# Patient Record
Sex: Female | Born: 1960 | Race: Asian | Hispanic: No | Marital: Married | Smoking: Never smoker
Health system: Southern US, Community
[De-identification: ages and names within clinical notes are randomized; demographics above are authoritative.]

## PROBLEM LIST (undated history)

## (undated) DIAGNOSIS — I10 Essential (primary) hypertension: Secondary | ICD-10-CM

## (undated) HISTORY — DX: Essential (primary) hypertension: I10

---

## 1981-04-25 HISTORY — PX: TUBAL LIGATION: SHX77

## 2013-12-23 ENCOUNTER — Ambulatory Visit (INDEPENDENT_AMBULATORY_CARE_PROVIDER_SITE_OTHER): Payer: 59 | Admitting: Internal Medicine

## 2013-12-23 ENCOUNTER — Encounter (INDEPENDENT_AMBULATORY_CARE_PROVIDER_SITE_OTHER): Payer: Self-pay

## 2013-12-23 ENCOUNTER — Encounter: Payer: Self-pay | Admitting: Internal Medicine

## 2013-12-23 VITALS — BP 110/70 | HR 84 | Temp 98.5°F | Ht 60.25 in | Wt 128.0 lb

## 2013-12-23 DIAGNOSIS — I10 Essential (primary) hypertension: Secondary | ICD-10-CM | POA: Insufficient documentation

## 2013-12-23 MED ORDER — AZILSARTAN MEDOXOMIL 80 MG PO TABS: 0.5000 | ORAL_TABLET | Freq: Every day | ORAL | Status: DC

## 2013-12-23 NOTE — Assessment & Plan Note (Signed)
Well controlled on current medication Refilled x 1 year  RTC in 1 year or sooner if needed

## 2013-12-23 NOTE — Patient Instructions (Addendum)

## 2013-12-23 NOTE — Progress Notes (Signed)
HPI  Pt presents to the clinic today to establish care. She recently moved from [place]. She has no concerns today.  Flu: never Tetanus: 10/2013 Pap Smear: 2014 Mammogram: 2013 Colon Screening: 2014 Eye Doctor: yearly Dentist: biannually  Past Medical History  Diagnosis Date  . Hypertension     Current Outpatient Prescriptions  Medication Sig Dispense Refill  . Azilsartan Medoxomil 80 MG TABS Take 0.5 tablets by mouth daily.       No current facility-administered medications for this visit.    No Known Allergies  Family History  Problem Relation Age of Onset  . Hypertension Mother     History   Social History  . Marital Status: Married    Spouse Name: N/A    Number of Children: N/A  . Years of Education: N/A   Occupational History  . Not on file.   Social History Main Topics  . Smoking status: Never Smoker   . Smokeless tobacco: Never Used  . Alcohol Use: Yes     Comment: occasional  . Drug Use: Not on file  . Sexual Activity: Not on file   Other Topics Concern  . Not on file   Social History Narrative  . No narrative on file    ROS:  Constitutional: Denies fever, malaise, fatigue, headache or abrupt weight changes.  HEENT: Denies eye pain, eye redness, ear pain, ringing in the ears, wax buildup, runny nose, nasal congestion, bloody nose, or sore throat. Respiratory: Denies difficulty breathing, shortness of breath, cough or sputum production.   Cardiovascular: Denies chest pain, chest tightness, palpitations or swelling in the hands or feet.  Gastrointestinal: Denies abdominal pain, bloating, constipation, diarrhea or blood in the stool.  GU: Denies frequency, urgency, pain with urination, blood in urine, odor or discharge. Musculoskeletal: Denies decrease in range of motion, difficulty with gait, muscle pain or joint pain and swelling.  Skin: Denies redness, rashes, lesions or ulcercations.  Neurological: Denies dizziness, difficulty with memory,  difficulty with speech or problems with balance and coordination.   No other specific complaints in a complete review of systems (except as listed in HPI above).  PE:  BP 110/70  Pulse 84  Temp(Src) 98.5 F (36.9 C) (Oral)  Ht 5' 0.25" (1.53 m)  Wt 128 lb (58.06 kg)  BMI 24.80 kg/m2  SpO2 97% Wt Readings from Last 3 Encounters:  12/23/13 128 lb (58.06 kg)    General: Appears her stated age, well developed, well nourished in NAD. Cardiovascular: Normal rate and rhythm. S1,S2 noted.  No murmur, rubs or gallops noted. No JVD or BLE edema. No carotid bruits noted. Pulmonary/Chest: Normal effort and positive vesicular breath sounds. No respiratory distress. No wheezes, rales or ronchi noted.  Neurological: Alert and oriented.      Assessment and Plan:

## 2013-12-23 NOTE — Progress Notes (Signed)
Pre visit review using our clinic review tool, if applicable. No additional management support is needed unless otherwise documented below in the visit note. 

## 2014-05-22 ENCOUNTER — Ambulatory Visit (INDEPENDENT_AMBULATORY_CARE_PROVIDER_SITE_OTHER): Payer: 59 | Admitting: Internal Medicine

## 2014-05-22 ENCOUNTER — Encounter: Payer: Self-pay | Admitting: Internal Medicine

## 2014-05-22 VITALS — BP 114/80 | HR 77 | Temp 98.2°F | Wt 118.0 lb

## 2014-05-22 DIAGNOSIS — I10 Essential (primary) hypertension: Secondary | ICD-10-CM

## 2014-05-22 NOTE — Progress Notes (Signed)
Pre visit review using our clinic review tool, if applicable. No additional management support is needed unless otherwise documented below in the visit note. 

## 2014-05-22 NOTE — Patient Instructions (Signed)

## 2014-05-22 NOTE — Progress Notes (Signed)
   Subjective:    Patient ID: Hannah Brooks, female    DOB: [birth year], 54 y.o.   MRN: [id number]  HPI  Pt presents to the clinic today to have a form filled out for work. 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 Her only PMH is high blood pressure which is well controlled on Azilsartan Medoxomil. Her BP today is 144/80. She takes her medication as prescribed. She denies side effects. She denies history of mental or emotional problems.  Review of Systems  Past Medical History  Diagnosis Date  . Hypertension     Current Outpatient Prescriptions  Medication Sig Dispense Refill  . Azilsartan Medoxomil 80 MG TABS Take 0.5 tablets (40 mg total) by mouth daily. 30 tablet 11   No current facility-administered medications for this visit.    No Known Allergies  Family History  Problem Relation Age of Onset  . Hypertension Mother   . Heart disease Father   . Cancer Neg Hx     History   Social History  . Marital Status: Married    Spouse Name: N/A    Number of Children: N/A  . Years of Education: N/A   Occupational History  . Not on file.   Social History Main Topics  . Smoking status: Never Smoker   . Smokeless tobacco: Never Used  . Alcohol Use: Yes     Comment: occasional  . Drug Use: No  . Sexual Activity: Not on file   Other Topics Concern  . Not on file   Social History Narrative     Constitutional: Denies fever, malaise, fatigue, headache or abrupt weight changes.  Respiratory: Denies difficulty breathing, shortness of breath, cough or sputum production.   Cardiovascular: Denies chest pain, chest tightness, palpitations or swelling in the hands or feet.  Musculoskeletal: Denies decrease in range of motion, difficulty with gait, muscle pain or joint pain and swelling.  Skin: Denies redness, rashes, lesions or ulcercations.  Neurological: Denies dizziness, difficulty with  memory, difficulty with speech or problems with balance and coordination.  Psych: Denies anxiety, depression, SI/HI.  No other specific complaints in a complete review of systems (except as listed in HPI above).     Objective:   Physical Exam   BP 114/80 mmHg  Pulse 77  Temp(Src) 98.2 F (36.8 C) (Oral)  Wt 118 lb (53.524 kg)  SpO2 98% Wt Readings from Last 3 Encounters:  05/22/14 118 lb (53.524 kg)  12/23/13 128 lb (58.06 kg)    General: Appears her stated age, well developed, well nourished in NAD. Skin: Warm, dry and intact. No rashes, lesions or ulcerations noted.  Cardiovascular: Normal rate and rhythm. S1,S2 noted.  No murmur, rubs or gallops noted. No JVD or BLE edema. No carotid bruits noted. Pulmonary/Chest: Normal effort and positive vesicular breath sounds. No respiratory distress. No wheezes, rales or ronchi noted.  Musculoskeletal: Normal range of motion. Strength 5/5 BUE/BLE. No difficulty with gait.  Neurological: Alert and oriented. Cranial nerves II-XII grossly intact. Coordination normal.  Psychiatric: Mood and affect normal. Behavior is normal. Judgment and thought content normal.        Assessment & Plan:   Form filled out that attests to her physical and mental health Form was copied and scanned into chart  RTC in 6 months or sooner if needed

## 2014-05-22 NOTE — Assessment & Plan Note (Signed)
Well controlled on current dose of Azilsartan Medoxomil She declines CBC and CMET at todays visit.

## 2014-05-23 ENCOUNTER — Telehealth: Payer: Self-pay | Admitting: Internal Medicine

## 2014-05-23 NOTE — Telephone Encounter (Signed)
emmi emailed °

## 2014-11-13 ENCOUNTER — Telehealth: Payer: Self-pay

## 2014-11-13 NOTE — Telephone Encounter (Signed)
Called patient to notify her of being due for a Mammogram. Patient stated that she is on a business trip in [place] right now and will be back on August 3rd. Patient says that she will call back when she returns home to set up a Mammogram.

## 2015-02-10 ENCOUNTER — Ambulatory Visit (INDEPENDENT_AMBULATORY_CARE_PROVIDER_SITE_OTHER): Payer: 59 | Admitting: Internal Medicine

## 2015-02-10 ENCOUNTER — Encounter: Payer: Self-pay | Admitting: Internal Medicine

## 2015-02-10 VITALS — BP 120/84 | HR 71 | Temp 98.1°F | Ht 60.33 in | Wt 133.0 lb

## 2015-02-10 DIAGNOSIS — D229 Melanocytic nevi, unspecified: Secondary | ICD-10-CM

## 2015-02-10 DIAGNOSIS — Z0000 Encounter for general adult medical examination without abnormal findings: Secondary | ICD-10-CM

## 2015-02-10 DIAGNOSIS — Z0001 Encounter for general adult medical examination with abnormal findings: Secondary | ICD-10-CM | POA: Diagnosis not present

## 2015-02-10 DIAGNOSIS — Z1239 Encounter for other screening for malignant neoplasm of breast: Secondary | ICD-10-CM

## 2015-02-10 DIAGNOSIS — E781 Pure hyperglyceridemia: Secondary | ICD-10-CM

## 2015-02-10 DIAGNOSIS — I10 Essential (primary) hypertension: Secondary | ICD-10-CM

## 2015-02-10 LAB — COMPREHENSIVE METABOLIC PANEL
ALK PHOS: 98 U/L (ref 39–117)
ALT: 25 U/L (ref 0–35)
AST: 19 U/L (ref 0–37)
Albumin: 4.2 g/dL (ref 3.5–5.2)
BUN: 24 mg/dL — ABNORMAL HIGH (ref 6–23)
CO2: 29 mEq/L (ref 19–32)
Calcium: 9.6 mg/dL (ref 8.4–10.5)
Chloride: 104 mEq/L (ref 96–112)
Creatinine, Ser: 0.82 mg/dL (ref 0.40–1.20)
GFR: 76.47 mL/min (ref >60.00)
GLUCOSE: 101 mg/dL — AB (ref 70–99)
POTASSIUM: 4.1 meq/L (ref 3.5–5.1)
Sodium: 138 mEq/L (ref 135–145)
Total Bilirubin: 0.3 mg/dL (ref 0.2–1.2)
Total Protein: 7.7 g/dL (ref 6.0–8.3)

## 2015-02-10 LAB — CBC
HEMATOCRIT: 40.5 % (ref 36.0–46.0)
HEMOGLOBIN: 13.3 g/dL (ref 12.0–15.0)
MCHC: 32.9 g/dL (ref 30.0–36.0)
MCV: 89.6 fl (ref 78.0–100.0)
Platelets: 298 10*3/uL (ref 150.0–400.0)
RBC: 4.52 Mil/uL (ref 3.87–5.11)
RDW: 12.4 % (ref 11.5–15.5)
WBC: 9.6 10*3/uL (ref 4.0–10.5)

## 2015-02-10 LAB — LIPID PANEL
CHOL/HDL RATIO: 5
Cholesterol: 190 mg/dL (ref 0–200)
HDL: 36.7 mg/dL — AB (ref >39.00)
Triglycerides: 679 mg/dL — ABNORMAL HIGH (ref 0.0–149.0)

## 2015-02-10 LAB — LDL CHOLESTEROL, DIRECT: Direct LDL: 82 mg/dL

## 2015-02-10 NOTE — Progress Notes (Signed)
Subjective:    Patient ID: Hannah Brooks, female    DOB: 04-Sep-1960, 54 y.o.   MRN: [id number]  HPI  Pt presents to the clinic today for her annual exam. She is also due for follow up HTN (see separate note).  Flu: never, will get today Tetanus: 10/2013 Pap Smear: 2014 Mammogram: 2013 Colon Screening: 2014 Vision Screening: yearly Dentist: biannually  Diet: She does not eat meat. She consumes mostly fruits and veggies. She does try to avoid fried foods. Exercise: None.  Review of Systems      Past Medical History  Diagnosis Date  . Hypertension     Current Outpatient Prescriptions  Medication Sig Dispense Refill  . Azilsartan Medoxomil 80 MG TABS Take 0.5 tablets (40 mg total) by mouth daily. 30 tablet 11   No current facility-administered medications for this visit.    No Known Allergies  Family History  Problem Relation Age of Onset  . Hypertension Mother   . Heart disease Father   . Cancer Neg Hx     Social History   Social History  . Marital Status: Married    Spouse Name: N/A  . Number of Children: N/A  . Years of Education: N/A   Occupational History  . Not on file.   Social History Main Topics  . Smoking status: Never Smoker   . Smokeless tobacco: Never Used  . Alcohol Use: Yes     Comment: occasional  . Drug Use: No  . Sexual Activity: Not on file   Other Topics Concern  . Not on file   Social History Narrative     Constitutional: Denies fever, malaise, fatigue, headache or abrupt weight changes.  HEENT: Denies eye pain, eye redness, ear pain, ringing in the ears, wax buildup, runny nose, nasal congestion, bloody nose, or sore throat. Respiratory: Denies difficulty breathing, shortness of breath, cough or sputum production.   Cardiovascular: Denies chest pain, chest tightness, palpitations or swelling in the hands or feet.  Gastrointestinal: Denies abdominal pain, bloating, constipation, diarrhea or blood in the stool.  GU: Denies  urgency, frequency, pain with urination, burning sensation, blood in urine, odor or discharge. Musculoskeletal: Denies decrease in range of motion, difficulty with gait, muscle pain or joint pain and swelling.  Skin: Pt reports mole under her right eye. Denies redness, rashes, or ulcercations.  Neurological: Denies dizziness, difficulty with memory, difficulty with speech or problems with balance and coordination.  Psych: Denies anxiety, depression, SI/HI.  No other specific complaints in a complete review of systems (except as listed in HPI above).  Objective:   Physical Exam   BP 120/84 mmHg  Pulse 71  Temp(Src) 98.1 F (36.7 C) (Oral)  Ht 5' 0.33" (1.532 m)  Wt 133 lb (60.328 kg)  BMI 25.70 kg/m2  SpO2 98% Wt Readings from Last 3 Encounters:  02/10/15 133 lb (60.328 kg)  05/22/14 118 lb (53.524 kg)  12/23/13 128 lb (58.06 kg)    General: Appears her stated age, well developed, well nourished in NAD. Skin: Warm, dry and intact.3 small round nevi noted under her right eye. HEENT: Head: normal shape and size; Eyes: sclera white, no icterus, conjunctiva pink, PERRLA and EOMs intact; Ears: Tm's gray and intact, normal light reflex; Throat/Mouth: Teeth present, mucosa pink and moist, no exudate, lesions or ulcerations noted.  Neck:  Neck supple, trachea midline. No masses, lumps or thyromegaly present.  Cardiovascular: Normal rate and rhythm. S1,S2 noted.  No murmur, rubs or gallops noted. No JVD  or BLE edema. No carotid bruits noted. Pulmonary/Chest: Normal effort and positive vesicular breath sounds. No respiratory distress. No wheezes, rales or ronchi noted.  Abdomen: Soft and nontender. Normal bowel sounds. No distention or masses noted. Liver, spleen and kidneys non palpable. Musculoskeletal: Strength 5/5 BUE/BLE. No signs of joint swelling. No difficulty with gait.  Neurological: Alert and oriented. Cranial nerves II-XII grossly intact. Coordination normal.  Psychiatric: Mood  and affect normal. Behavior is normal. Judgment and thought content normal.       Assessment & Plan:   Preventative Health Maintenance:  Encouraged her to consume a balanced diet and start and exercise regimen She declines flu shot today Tetanus UTD Mammogram ordered, she will call [facility] to schedule Pap due 2017 Colonoscopy due 2024 Encouraged her to see an eye doctor and dentist annually  HPI:  She is also due for follow up of HTN. She takes her Azilsartan daily as prescribed. She denies chest pain, chest tightness or shortness of breath. There is no ECG in the system for review. Her BP today is 120/84.  Review of Systems:   Past Medical History  Diagnosis Date  . Hypertension     Current Outpatient Prescriptions  Medication Sig Dispense Refill  . Azilsartan Medoxomil 80 MG TABS Take 0.5 tablets (40 mg total) by mouth daily. 30 tablet 11   No current facility-administered medications for this visit.    No Known Allergies  Family History  Problem Relation Age of Onset  . Hypertension Mother   . Heart disease Father   . Cancer Neg Hx     Social History   Social History  . Marital Status: Married    Spouse Name: N/A  . Number of Children: N/A  . Years of Education: N/A   Occupational History  . Not on file.   Social History Main Topics  . Smoking status: Never Smoker   . Smokeless tobacco: Never Used  . Alcohol Use: Yes     Comment: occasional  . Drug Use: No  . Sexual Activity: Not on file   Other Topics Concern  . Not on file   Social History Narrative     Constitutional: Denies fever, malaise, fatigue, headache or abrupt weight changes.  HEENT: Denies eye pain, eye redness, ear pain, ringing in the ears, wax buildup, runny nose, nasal congestion, bloody nose, or sore throat. Respiratory: Denies difficulty breathing, shortness of breath, cough or sputum production.   Cardiovascular: Denies chest pain, chest tightness, palpitations or  swelling in the hands or feet.  Gastrointestinal: Denies abdominal pain, bloating, constipation, diarrhea or blood in the stool.  GU: Denies urgency, frequency, pain with urination, burning sensation, blood in urine, odor or discharge. Musculoskeletal: Denies decrease in range of motion, difficulty with gait, muscle pain or joint pain and swelling.  Skin: Denies redness, rashes, lesions or ulcercations.  Neurological: Denies dizziness, difficulty with memory, difficulty with speech or problems with balance and coordination.  Psych: Denies anxiety, depression, SI/HI.  No other specific complaints in a complete review of systems (except as listed in HPI above).  Objective: BP 120/84 mmHg  Pulse 71  Temp(Src) 98.1 F (36.7 C) (Oral)  Ht 5' 0.33" (1.532 m)  Wt 133 lb (60.328 kg)  BMI 25.70 kg/m2  SpO2 98%  General: Appears her stated age, well developed, well nourished in NAD. Skin: Warm, dry and intact. 3 small round nevi noted under her right eye. HEENT: Head: normal shape and size; Eyes: sclera white, no icterus, conjunctiva  pink, PERRLA and EOMs intact; Ears: Tm's gray and intact, normal light reflex; Throat/Mouth: Teeth present, mucosa pink and moist, no exudate, lesions or ulcerations noted.  Neck:  Neck supple, trachea midline. No masses, lumps or thyromegaly present.  Cardiovascular: Normal rate and rhythm. S1,S2 noted.  No murmur, rubs or gallops noted. No JVD or BLE edema. No carotid bruits noted. Pulmonary/Chest: Normal effort and positive vesicular breath sounds. No respiratory distress. No wheezes, rales or ronchi noted.  Abdomen: Soft and nontender. Normal bowel sounds. No distention or masses noted. Liver, spleen and kidneys non palpable. Musculoskeletal: Strength 5/5 BUE/BLE. No signs of joint swelling. No difficulty with gait.  Neurological: Alert and oriented. Cranial nerves II-XII grossly intact. Coordination normal.   Assessment and Plan:  HTN:  Will check CBC and  CMET today ECG today normal Azilsartan refilled today per request  [place]:  Benign Referral to derm to have them removed  RTC in 1 year or sooner if needed

## 2015-02-10 NOTE — Patient Instructions (Signed)
Health Maintenance, Female Adopting a healthy lifestyle and getting preventive care can go a long way to promote health and wellness. Talk with your health care provider about what schedule of regular examinations is right for you. This is a good chance for you to check in with your provider about disease prevention and staying healthy. In between checkups, there are plenty of things you can do on your own. Experts have done a lot of research about which lifestyle changes and preventive measures are most likely to keep you healthy. Ask your health care provider for more information. WEIGHT AND DIET  Eat a healthy diet  Be sure to include plenty of vegetables, fruits, low-fat dairy products, and lean protein.  Do not eat a lot of foods high in solid fats, added sugars, or salt.  Get regular exercise. This is one of the most important things you can do for your health.  Most adults should exercise for at least 150 minutes each week. The exercise should increase your heart rate and make you sweat (moderate-intensity exercise).  Most adults should also do strengthening exercises at least twice a week. This is in addition to the moderate-intensity exercise.  Maintain a healthy weight  Body mass index (BMI) is a measurement that can be used to identify possible weight problems. It estimates body fat based on height and weight. Your health care provider can help determine your BMI and help you achieve or maintain a healthy weight.  For females 20 years of age and older:   A BMI below 18.5 is considered underweight.  A BMI of 18.5 to 24.9 is normal.  A BMI of 25 to 29.9 is considered overweight.  A BMI of 30 and above is considered obese.  Watch levels of cholesterol and blood lipids  You should start having your blood tested for lipids and cholesterol at 56 years of age, then have this test every 5 years.  You may need to have your cholesterol levels checked more often if:  Your lipid  or cholesterol levels are high.  You are older than 56 years of age.  You are at high risk for heart disease.  CANCER SCREENING   Lung Cancer  Lung cancer screening is recommended for adults 55-80 years old who are at high risk for lung cancer because of a history of smoking.  A yearly low-dose CT scan of the lungs is recommended for people who:  Currently smoke.  Have quit within the past 15 years.  Have at least a 30-pack-year history of smoking. A pack year is smoking an average of one pack of cigarettes a day for 1 year.  Yearly screening should continue until it has been 15 years since you quit.  Yearly screening should stop if you develop a health problem that would prevent you from having lung cancer treatment.  Breast Cancer  Practice breast self-awareness. This means understanding how your breasts normally appear and feel.  It also means doing regular breast self-exams. Let your health care provider know about any changes, no matter how small.  If you are in your 20s or 30s, you should have a clinical breast exam (CBE) by a health care provider every 1-3 years as part of a regular health exam.  If you are 40 or older, have a CBE every year. Also consider having a breast X-ray (mammogram) every year.  If you have a family history of breast cancer, talk to your health care provider about genetic screening.  If you   are at high risk for breast cancer, talk to your health care provider about having an MRI and a mammogram every year.  Breast cancer gene (BRCA) assessment is recommended for women who have family members with BRCA-related cancers. BRCA-related cancers include:  Breast.  Ovarian.  Tubal.  Peritoneal cancers.  Results of the assessment will determine the need for genetic counseling and BRCA1 and BRCA2 testing. Cervical Cancer Your health care provider may recommend that you be screened regularly for cancer of the pelvic organs (ovaries, uterus, and  vagina). This screening involves a pelvic examination, including checking for microscopic changes to the surface of your cervix (Pap test). You may be encouraged to have this screening done every 3 years, beginning at age 21.  For women ages 30-65, health care providers may recommend pelvic exams and Pap testing every 3 years, or they may recommend the Pap and pelvic exam, combined with testing for human papilloma virus (HPV), every 5 years. Some types of HPV increase your risk of cervical cancer. Testing for HPV may also be done on women of any age with unclear Pap test results.  Other health care providers may not recommend any screening for nonpregnant women who are considered low risk for pelvic cancer and who do not have symptoms. Ask your health care provider if a screening pelvic exam is right for you.  If you have had past treatment for cervical cancer or a condition that could lead to cancer, you need Pap tests and screening for cancer for at least 20 years after your treatment. If Pap tests have been discontinued, your risk factors (such as having a new sexual partner) need to be reassessed to determine if screening should resume. Some women have medical problems that increase the chance of getting cervical cancer. In these cases, your health care provider may recommend more frequent screening and Pap tests. Colorectal Cancer  This type of cancer can be detected and often prevented.  Routine colorectal cancer screening usually begins at 56 years of age and continues through 56 years of age.  Your health care provider may recommend screening at an earlier age if you have risk factors for colon cancer.  Your health care provider may also recommend using home test kits to check for hidden blood in the stool.  A small camera at the end of a tube can be used to examine your colon directly (sigmoidoscopy or colonoscopy). This is done to check for the earliest forms of colorectal  cancer.  Routine screening usually begins at age 50.  Direct examination of the colon should be repeated every 5-10 years through 56 years of age. However, you may need to be screened more often if early forms of precancerous polyps or small growths are found. Skin Cancer  Check your skin from head to toe regularly.  Tell your health care provider about any new moles or changes in moles, especially if there is a change in a mole's shape or color.  Also tell your health care provider if you have a mole that is larger than the size of a pencil eraser.  Always use sunscreen. Apply sunscreen liberally and repeatedly throughout the day.  Protect yourself by wearing long sleeves, pants, a wide-brimmed hat, and sunglasses whenever you are outside. HEART DISEASE, DIABETES, AND HIGH BLOOD PRESSURE   High blood pressure causes heart disease and increases the risk of stroke. High blood pressure is more likely to develop in:  People who have blood pressure in the high end   of the normal range (130-139/85-89 mm Hg).  People who are overweight or obese.  People who are African American.  If you are 38-23 years of age, have your blood pressure checked every 3-5 years. If you are 61 years of age or older, have your blood pressure checked every year. You should have your blood pressure measured twice--once when you are at a hospital or clinic, and once when you are not at a hospital or clinic. Record the average of the two measurements. To check your blood pressure when you are not at a hospital or clinic, you can use:  An automated blood pressure machine at a pharmacy.  A home blood pressure monitor.  If you are between 45 years and 39 years old, ask your health care provider if you should take aspirin to prevent strokes.  Have regular diabetes screenings. This involves taking a blood sample to check your fasting blood sugar level.  If you are at a normal weight and have a low risk for diabetes,  have this test once every three years after 56 years of age.  If you are overweight and have a high risk for diabetes, consider being tested at a younger age or more often. PREVENTING INFECTION  Hepatitis B  If you have a higher risk for hepatitis B, you should be screened for this virus. You are considered at high risk for hepatitis B if:  You were born in a country where hepatitis B is common. Ask your health care provider which countries are considered high risk.  Your parents were born in a high-risk country, and you have not been immunized against hepatitis B (hepatitis B vaccine).  You have HIV or AIDS.  You use needles to inject street drugs.  You live with someone who has hepatitis B.  You have had sex with someone who has hepatitis B.  You get hemodialysis treatment.  You take certain medicines for conditions, including cancer, organ transplantation, and autoimmune conditions. Hepatitis C  Blood testing is recommended for:  Everyone born from [birth year] through 1967.  Anyone with known risk factors for hepatitis C. Sexually transmitted infections (STIs)  You should be screened for sexually transmitted infections (STIs) including gonorrhea and chlamydia if:  You are sexually active and are younger than 56 years of age.  You are older than 56 years of age and your health care provider tells you that you are at risk for this type of infection.  Your sexual activity has changed since you were last screened and you are at an increased risk for chlamydia or gonorrhea. Ask your health care provider if you are at risk.  If you do not have HIV, but are at risk, it may be recommended that you take a prescription medicine daily to prevent HIV infection. This is called pre-exposure prophylaxis (PrEP). You are considered at risk if:  You are sexually active and do not regularly use condoms or know the HIV status of your partner(s).  You take drugs by injection.  You are sexually  active with a partner who has HIV. Talk with your health care provider about whether you are at high risk of being infected with HIV. If you choose to begin PrEP, you should first be tested for HIV. You should then be tested every 3 months for as long as you are taking PrEP.  PREGNANCY   If you are premenopausal and you may become pregnant, ask your health care provider about preconception counseling.  If you may  become pregnant, take 400 to 800 micrograms (mcg) of folic acid every day.  If you want to prevent pregnancy, talk to your health care provider about birth control (contraception). OSTEOPOROSIS AND MENOPAUSE   Osteoporosis is a disease in which the bones lose minerals and strength with aging. This can result in serious bone fractures. Your risk for osteoporosis can be identified using a bone density scan.  If you are 61 years of age or older, or if you are at risk for osteoporosis and fractures, ask your health care provider if you should be screened.  Ask your health care provider whether you should take a calcium or vitamin D supplement to lower your risk for osteoporosis.  Menopause may have certain physical symptoms and risks.  Hormone replacement therapy may reduce some of these symptoms and risks. Talk to your health care provider about whether hormone replacement therapy is right for you.  HOME CARE INSTRUCTIONS   Schedule regular health, dental, and eye exams.  Stay current with your immunizations.   Do not use any tobacco products including cigarettes, chewing tobacco, or electronic cigarettes.  If you are pregnant, do not drink alcohol.  If you are breastfeeding, limit how much and how often you drink alcohol.  Limit alcohol intake to no more than 1 drink per day for nonpregnant women. One drink equals 12 ounces of beer, 5 ounces of wine, or 1 ounces of hard liquor.  Do not use street drugs.  Do not share needles.  Ask your health care provider for help if  you need support or information about quitting drugs.  Tell your health care provider if you often feel depressed.  Tell your health care provider if you have ever been abused or do not feel safe at home.   This information is not intended to replace advice given to you by your health care provider. Make sure you discuss any questions you have with your health care provider.   Document Released: 10/25/2010 Document Revised: 05/02/2014 Document Reviewed: 03/13/2013 Elsevier Interactive Patient Education [employer].

## 2015-02-10 NOTE — Progress Notes (Signed)
   Subjective:    Patient ID: Hannah Brooks, female    DOB: [birth year], 54 y.o.   MRN: [id number]  HPI Hannah Brooks is a 54 year old female who presents today for her annual exam.  She has a past medical history of hypertension.  Today her blood pressure is 120/84, she has been taking her azilsartan medoxomil daily as prescribed. She does have some moles that she would like removed under her right eye, will make dermatology referral.    Flu: declines Tdap: July 2015 Pap: 2014, normal results  Mammogram: 2013 Vision: Yearly. Wears reading glasses.  Dentist: biannually   Diet: Mostly veggies, does not eat meat often. Includes fresh fruit in her diet.  Exercise: No regular exercise.    Review of Systems  Constitutional: Negative for fever, chills and fatigue.  HENT: Negative for congestion, postnasal drip and rhinorrhea.   Respiratory: Negative for cough, shortness of breath and wheezing.   Cardiovascular: Negative for chest pain, palpitations and leg swelling.  Gastrointestinal: Negative for diarrhea and constipation.  Genitourinary: Negative for dysuria, frequency and flank pain.  Musculoskeletal: Negative for myalgias, arthralgias and gait problem.  Skin: Negative for color change, pallor, rash and wound.  Neurological: Negative for dizziness, light-headedness and headaches.  Psychiatric/Behavioral: Negative for agitation. The patient is not nervous/anxious.    Family History  Problem Relation Age of Onset  . Hypertension Mother   . Heart disease Father   . Cancer Neg Hx    Current Outpatient Prescriptions on File Prior to Visit  Medication Sig Dispense Refill  . Azilsartan Medoxomil 80 MG TABS Take 0.5 tablets (40 mg total) by mouth daily. 30 tablet 11   No current facility-administered medications on file prior to visit.       Objective:   Physical Exam  Constitutional: She is oriented to person, place, and time. She appears well-developed and well-nourished.  HENT:    Head: Normocephalic and atraumatic.  Right Ear: External ear normal.  Left Ear: External ear normal.  Mouth/Throat: Oropharynx is clear and moist. No oropharyngeal exudate.  Eyes: Conjunctivae are normal. Pupils are equal, round, and reactive to light.  Neck: Normal range of motion. Neck supple. No thyromegaly present.  Cardiovascular: Normal rate, regular rhythm, normal heart sounds and intact distal pulses.   No murmur heard. Pulmonary/Chest: Effort normal and breath sounds normal.  Abdominal: Soft. Bowel sounds are normal. There is no tenderness.  Musculoskeletal: Normal range of motion.  Lymphadenopathy:    She has no cervical adenopathy.  Neurological: She is alert and oriented to person, place, and time.  Skin: Skin is warm and dry.    BP 120/84 mmHg  Pulse 71  Temp(Src) 98.1 F (36.7 C) (Oral)  Ht 5' 0.33" (1.532 m)  Wt 133 lb (60.328 kg)  BMI 25.70 kg/m2  SpO2 98%       Assessment & Plan:  1. Hypertension -BMP today. Refill Azilsartan 80mg  2. Obesity  -Lipid panel, TSH. 3. Preventative health Colonscopy not due until 2023 Mammogram: will set up

## 2015-02-10 NOTE — Progress Notes (Signed)
Pre visit review using our clinic review tool, if applicable. No additional management support is needed unless otherwise documented below in the visit note. 

## 2015-02-12 MED ORDER — EZETIMIBE 10 MG PO TABS: 10.0000 mg | ORAL_TABLET | Freq: Every day | ORAL | Status: DC

## 2015-02-12 NOTE — Addendum Note (Signed)
Addended by: Lurlean Nanny on: 02/12/2015 11:04 AM   Modules accepted: Orders

## 2015-05-15 ENCOUNTER — Other Ambulatory Visit (INDEPENDENT_AMBULATORY_CARE_PROVIDER_SITE_OTHER): Payer: 59

## 2015-05-15 DIAGNOSIS — E781 Pure hyperglyceridemia: Secondary | ICD-10-CM | POA: Diagnosis not present

## 2015-05-15 LAB — LIPID PANEL
CHOLESTEROL: 196 mg/dL (ref 125–200)
HDL: 41 mg/dL — ABNORMAL LOW (ref >=46)
LDL CALC: 112 mg/dL (ref <130)
Total CHOL/HDL Ratio: 4.8 Ratio (ref <=5.0)
Triglycerides: 215 mg/dL — ABNORMAL HIGH (ref <150)
VLDL: 43 mg/dL — AB (ref <30)

## 2015-07-14 ENCOUNTER — Encounter: Payer: Self-pay | Admitting: Internal Medicine

## 2015-07-14 ENCOUNTER — Ambulatory Visit (INDEPENDENT_AMBULATORY_CARE_PROVIDER_SITE_OTHER): Payer: 59 | Admitting: Internal Medicine

## 2015-07-14 VITALS — BP 120/78 | HR 66 | Temp 98.1°F | Wt 131.0 lb

## 2015-07-14 DIAGNOSIS — S46819A Strain of other muscles, fascia and tendons at shoulder and upper arm level, unspecified arm, initial encounter: Secondary | ICD-10-CM | POA: Diagnosis not present

## 2015-07-14 DIAGNOSIS — M545 Low back pain, unspecified: Secondary | ICD-10-CM

## 2015-07-14 DIAGNOSIS — M25532 Pain in left wrist: Secondary | ICD-10-CM

## 2015-07-14 MED ORDER — METHOCARBAMOL 500 MG PO TABS: 500.0000 mg | ORAL_TABLET | Freq: Three times a day (TID) | ORAL | Status: DC | PRN

## 2015-07-14 NOTE — Progress Notes (Signed)
Pre visit review using our clinic review tool, if applicable. No additional management support is needed unless otherwise documented below in the visit note. 

## 2015-07-14 NOTE — Patient Instructions (Signed)

## 2015-07-14 NOTE — Progress Notes (Signed)
Subjective:    Patient ID: Hannah Brooks, female    DOB: Aug 03, 1960, 55 y.o.   MRN: [id number]  HPI  Pt presents to the clinic today with c/o pain in her bilateral shoulder, left wrist, left hand and lower back. This started about 4 months ago. She describes the pain as sore and throbbing. She has noticed a decrease in ROM but denies weakness. The pain is worse with using her hand or shoulder, or picking anything up. She denies numbness and tingling in her hands. She denies neck or leg pain. She has tried Ibuprofen without relief. 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  Review of Systems      Past Medical History  Diagnosis Date  . Hypertension     Current Outpatient Prescriptions  Medication Sig Dispense Refill  . Azilsartan Medoxomil 80 MG TABS Take 0.5 tablets (40 mg total) by mouth daily. 30 tablet 11  . ezetimibe (ZETIA) 10 MG tablet Take 1 tablet (10 mg total) by mouth daily. 90 tablet 3   No current facility-administered medications for this visit.    No Known Allergies  Family History  Problem Relation Age of Onset  . Hypertension Mother   . Heart disease Father   . Cancer Neg Hx     Social History   Social History  . Marital Status: Married    Spouse Name: N/A  . Number of Children: N/A  . Years of Education: N/A   Occupational History  . Not on file.   Social History Main Topics  . Smoking status: Never Smoker   . Smokeless tobacco: Never Used  . Alcohol Use: Yes     Comment: occasional  . Drug Use: No  . Sexual Activity: Not on file   Other Topics Concern  . Not on file   Social History Narrative     Constitutional: Denies fever, malaise, fatigue, headache or abrupt weight changes.  Respiratory: Denies difficulty breathing, shortness of breath, cough or sputum production.   Cardiovascular: Denies chest pain, chest tightness, palpitations or swelling in the hands or feet. . Musculoskeletal: Pt  reports joint pain. Denies decrease in range of motion, difficulty with gait, muscle pain or joint swelling.  Skin: Denies redness, rashes, lesions or ulcercations.  Neurological: Denies dizziness, difficulty with memory, difficulty with speech or problems with balance and coordination.   No other specific complaints in a complete review of systems (except as listed in HPI above).  Objective:   Physical Exam   BP 120/78 mmHg  Pulse 66  Temp(Src) 98.1 F (36.7 C) (Oral)  Wt 131 lb (59.421 kg)  SpO2 98% Wt Readings from Last 3 Encounters:  07/14/15 131 lb (59.421 kg)  02/10/15 133 lb (60.328 kg)  05/22/14 118 lb (53.524 kg)    General: Appears her stated age, well developed, well nourished in NAD. Musculoskeletal: Normal flexion, extension and rotation of the cervical spine. No bony tenderness noted over the cervical spine. Normal internal and external rotation of the shoulders. No pain with palpation of the left shoulder. Pain with palpation over the right pectoral muscle, near the shoulder. Tension noted on the trapezius bilaterally. Normal flexion, extension and rotation of the left wrist. Mild swelling of the left wrist. Some crepitus noted with ROM of the wrist. Normal flexion, extension and rotation of the lumbar spine. No bony tenderness noted over the lumbar spine. Strength 5/5 BUE/BLE. No difficulty with gait.  Neurological: Alert  and oriented. Sensation intact to BUE/BLE.   BMET    Component Value Date/Time   NA 138 02/10/2015 1456   K 4.1 02/10/2015 1456   CL 104 02/10/2015 1456   CO2 29 02/10/2015 1456   GLUCOSE 101* 02/10/2015 1456   BUN 24* 02/10/2015 1456   CREATININE 0.82 02/10/2015 1456   CALCIUM 9.6 02/10/2015 1456    Lipid Panel     Component Value Date/Time   CHOL 196 05/15/2015 1604   TRIG 215* 05/15/2015 1604   HDL 41* 05/15/2015 1604   CHOLHDL 4.8 05/15/2015 1604   VLDL 43* 05/15/2015 1604   LDLCALC 112 05/15/2015 1604    CBC    Component  Value Date/Time   WBC 9.6 02/10/2015 1456   RBC 4.52 02/10/2015 1456   HGB 13.3 02/10/2015 1456   HCT 40.5 02/10/2015 1456   PLT 298.0 02/10/2015 1456   MCV 89.6 02/10/2015 1456   MCHC 32.9 02/10/2015 1456   RDW 12.4 02/10/2015 1456    Hgb A1C No results found for: HGBA1C      Assessment & Plan:   Strain of trapezius muscles:  Discussed exercises to stretch the area Continue Ibuprofen A heating pad and massage may be helpful eRx for Robaxin 500 mg TID prn  Left wrist pain and swelling:  Likely arthritis Continue Ibuprofen Ok to wear a wrist brace when being physically active If persist, consider xray  RTC as needed or if symptoms persist or worsen

## 2015-08-21 ENCOUNTER — Ambulatory Visit (INDEPENDENT_AMBULATORY_CARE_PROVIDER_SITE_OTHER): Payer: 59 | Admitting: Internal Medicine

## 2015-08-21 ENCOUNTER — Encounter: Payer: Self-pay | Admitting: Internal Medicine

## 2015-08-21 VITALS — BP 122/76 | HR 78 | Temp 98.8°F | Wt 131.0 lb

## 2015-08-21 DIAGNOSIS — I10 Essential (primary) hypertension: Secondary | ICD-10-CM | POA: Diagnosis not present

## 2015-08-21 DIAGNOSIS — J301 Allergic rhinitis due to pollen: Secondary | ICD-10-CM

## 2015-08-21 DIAGNOSIS — Z0289 Encounter for other administrative examinations: Secondary | ICD-10-CM | POA: Diagnosis not present

## 2015-08-21 DIAGNOSIS — Z111 Encounter for screening for respiratory tuberculosis: Secondary | ICD-10-CM

## 2015-08-21 MED ORDER — AZILSARTAN MEDOXOMIL 80 MG PO TABS: 0.5000 | ORAL_TABLET | Freq: Every day | ORAL | Status: DC

## 2015-08-21 NOTE — Progress Notes (Signed)
Pre visit review using our clinic review tool, if applicable. No additional management support is needed unless otherwise documented below in the visit note. 

## 2015-08-22 ENCOUNTER — Encounter: Payer: Self-pay | Admitting: Internal Medicine

## 2015-08-22 NOTE — Patient Instructions (Signed)
Allergic Rhinitis Allergic rhinitis is when the mucous membranes in the nose respond to allergens. Allergens are particles in the air that cause your body to have an allergic reaction. This causes you to release allergic antibodies. Through a chain of events, these eventually cause you to release histamine into the blood stream. Although meant to protect the body, it is this release of histamine that causes your discomfort, such as frequent sneezing, congestion, and an itchy, runny nose.  CAUSES Seasonal allergic rhinitis (hay fever) is caused by pollen allergens that may come from grasses, trees, and weeds. Year-round allergic rhinitis (perennial allergic rhinitis) is caused by allergens such as house dust mites, pet dander, and mold spores. SYMPTOMS  Nasal stuffiness (congestion).  Itchy, runny nose with sneezing and tearing of the eyes. DIAGNOSIS Your health care provider can help you determine the allergen or allergens that trigger your symptoms. If you and your health care provider are unable to determine the allergen, skin or blood testing may be used. Your health care provider will diagnose your condition after taking your health history and performing a physical exam. Your health care provider may assess you for other related conditions, such as asthma, pink eye, or an ear infection. TREATMENT Allergic rhinitis does not have a cure, but it can be controlled by:  Medicines that block allergy symptoms. These may include allergy shots, nasal sprays, and oral antihistamines.  Avoiding the allergen. Hay fever may often be treated with antihistamines in pill or nasal spray forms. Antihistamines block the effects of histamine. There are over-the-counter medicines that may help with nasal congestion and swelling around the eyes. Check with your health care provider before taking or giving this medicine. If avoiding the allergen or the medicine prescribed do not work, there are many new medicines  your health care provider can prescribe. Stronger medicine may be used if initial measures are ineffective. Desensitizing injections can be used if medicine and avoidance does not work. Desensitization is when a patient is given ongoing shots until the body becomes less sensitive to the allergen. Make sure you follow up with your health care provider if problems continue. HOME CARE INSTRUCTIONS It is not possible to completely avoid allergens, but you can reduce your symptoms by taking steps to limit your exposure to them. It helps to know exactly what you are allergic to so that you can avoid your specific triggers. SEEK MEDICAL CARE IF:  You have a fever.  You develop a cough that does not stop easily (persistent).  You have shortness of breath.  You start wheezing.  Symptoms interfere with normal daily activities.   This information is not intended to replace advice given to you by your health care provider. Make sure you discuss any questions you have with your health care provider.   Document Released: 01/04/2001 Document Revised: 05/02/2014 Document Reviewed: 12/17/2012 Elsevier Interactive Patient Education 2016 [employer]  

## 2015-08-22 NOTE — Progress Notes (Signed)
Subjective:    Patient ID: Hannah Brooks, female    DOB: March 05, 1961, 55 y.o.   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 Only PMH is HTN which is well controlled. She will need TB testing today as well.  She also c/o runny nose, sneezing and sore throat. This started 4 days ago. She is blowing clear mucous out of her nose. She denies difficulty swallowing. She denies fever, chills or body aches. She has tried Mucinex cold medicine and Zyrtec with minimal relief. She has no history of seasonal allergies. She has not had sick contacts.  Review of Systems      Past Medical History  Diagnosis Date  . Hypertension     Current Outpatient Prescriptions  Medication Sig Dispense Refill  . Azilsartan Medoxomil 80 MG TABS Take 0.5 tablets (40 mg total) by mouth daily. 30 tablet 5  . ezetimibe (ZETIA) 10 MG tablet Take 1 tablet (10 mg total) by mouth daily. 90 tablet 3  . methocarbamol (ROBAXIN) 500 MG tablet Take 1 tablet (500 mg total) by mouth every 8 (eight) hours as needed for muscle spasms. 20 tablet 0   No current facility-administered medications for this visit.    No Known Allergies  Family History  Problem Relation Age of Onset  . Hypertension Mother   . Heart disease Father   . Cancer Neg Hx     Social History   Social History  . Marital Status: Married    Spouse Name: N/A  . Number of Children: N/A  . Years of Education: N/A   Occupational History  . Not on file.   Social History Main Topics  . Smoking status: Never Smoker   . Smokeless tobacco: Never Used  . Alcohol Use: Yes     Comment: occasional  . Drug Use: No  . Sexual Activity: Not on file   Other Topics Concern  . Not on file   Social History Narrative     Constitutional: Denies fever, malaise, fatigue, headache or abrupt weight changes. HEENT: Pt reports runny nose and sore throat. Denies ear pain, nasal  congestion.  Respiratory: Denies difficulty breathing, shortness of breath, cough or sputum production.   Cardiovascular: Denies chest pain, chest tightness, palpitations or swelling in the hands or feet.  Musculoskeletal: Denies decrease in range of motion, difficulty with gait, muscle pain or joint pain and swelling.   Neurological: Denies dizziness, difficulty with memory, difficulty with speech or problems with balance and coordination.  Psych: Denies anxiety, depression, SI/HI.  No other specific complaints in a complete review of systems (except as listed in HPI above).  Objective:   Physical Exam   BP 122/76 mmHg  Pulse 78  Temp(Src) 98.8 F (37.1 C) (Oral)  Wt 131 lb (59.421 kg)  SpO2 98% Wt Readings from Last 3 Encounters:  08/21/15 131 lb (59.421 kg)  07/14/15 131 lb (59.421 kg)  02/10/15 133 lb (60.328 kg)    General: Appears her stated age, well developed, well nourished in NAD. HEENT: Head: normal shape and size, no sinus tenderness noted; Eyes: sclera white, no icterus, conjunctiva pink; Ears: Tm's gray and intact, normal light reflex; Nose: mucosa boggy and moist, septum midline; Throat/Mouth: Teeth present, mucosa pink and moist, + PND, no exudate, lesions or ulcerations noted.  Neck:  No adenopathy noted.  Cardiovascular: Normal rate and rhythm. S1,S2 noted.  No murmur,  rubs or gallops noted.  Pulmonary/Chest: Normal effort and positive vesicular breath sounds. No respiratory distress. No wheezes, rales or ronchi noted.  Neurological: Alert and oriented.  Psychiatric: Mood and affect normal. Behavior is normal. Judgment and thought content normal.     BMET    Component Value Date/Time   NA 138 02/10/2015 1456   K 4.1 02/10/2015 1456   CL 104 02/10/2015 1456   CO2 29 02/10/2015 1456   GLUCOSE 101* 02/10/2015 1456   BUN 24* 02/10/2015 1456   CREATININE 0.82 02/10/2015 1456   CALCIUM 9.6 02/10/2015 1456    Lipid Panel     Component Value Date/Time    CHOL 196 05/15/2015 1604   TRIG 215* 05/15/2015 1604   HDL 41* 05/15/2015 1604   CHOLHDL 4.8 05/15/2015 1604   VLDL 43* 05/15/2015 1604   LDLCALC 112 05/15/2015 1604    CBC    Component Value Date/Time   WBC 9.6 02/10/2015 1456   RBC 4.52 02/10/2015 1456   HGB 13.3 02/10/2015 1456   HCT 40.5 02/10/2015 1456   PLT 298.0 02/10/2015 1456   MCV 89.6 02/10/2015 1456   MCHC 32.9 02/10/2015 1456   RDW 12.4 02/10/2015 1456    Hgb A1C No results found for: HGBA1C      Assessment & Plan:   Allergic Rhinitis:  Stop Mucinex Continue Zyrtec at night Add in Flonase in the morning Return precautions given  Encounter for form completion:  Form filled out, original given back to pt TB test done- she will return Monday to have read  RTC iin 6 months for annual exam

## 2015-08-22 NOTE — Assessment & Plan Note (Signed)
Controlled on current regimen Medication refilled today

## 2015-08-24 LAB — TB SKIN TEST
Induration: 0 mm
TB Skin Test: NEGATIVE

## 2017-05-11 ENCOUNTER — Ambulatory Visit: Payer: 59 | Admitting: Internal Medicine

## 2017-05-11 ENCOUNTER — Encounter: Payer: Self-pay | Admitting: Internal Medicine

## 2017-05-11 ENCOUNTER — Encounter (INDEPENDENT_AMBULATORY_CARE_PROVIDER_SITE_OTHER): Payer: Self-pay

## 2017-05-11 VITALS — BP 124/70 | HR 83 | Temp 98.1°F | Wt 123.2 lb

## 2017-05-11 DIAGNOSIS — Z23 Encounter for immunization: Secondary | ICD-10-CM | POA: Diagnosis not present

## 2017-05-11 DIAGNOSIS — I10 Essential (primary) hypertension: Secondary | ICD-10-CM

## 2017-05-11 DIAGNOSIS — E559 Vitamin D deficiency, unspecified: Secondary | ICD-10-CM

## 2017-05-11 DIAGNOSIS — Z0000 Encounter for general adult medical examination without abnormal findings: Secondary | ICD-10-CM

## 2017-05-11 DIAGNOSIS — E781 Pure hyperglyceridemia: Secondary | ICD-10-CM

## 2017-05-11 LAB — COMPREHENSIVE METABOLIC PANEL
ALT: 19 U/L (ref 0–35)
AST: 19 U/L (ref 0–37)
Albumin: 4 g/dL (ref 3.5–5.2)
Alkaline Phosphatase: 86 U/L (ref 39–117)
BILIRUBIN TOTAL: 0.6 mg/dL (ref 0.2–1.2)
BUN: 19 mg/dL (ref 6–23)
CO2: 29 meq/L (ref 19–32)
Calcium: 8.9 mg/dL (ref 8.4–10.5)
Chloride: 105 mEq/L (ref 96–112)
Creatinine, Ser: 0.61 mg/dL (ref 0.40–1.20)
GFR: 106.74 mL/min (ref >60.00)
GLUCOSE: 132 mg/dL — AB (ref 70–99)
POTASSIUM: 3.9 meq/L (ref 3.5–5.1)
SODIUM: 140 meq/L (ref 135–145)
Total Protein: 7 g/dL (ref 6.0–8.3)

## 2017-05-11 LAB — CBC
HCT: 39.7 % (ref 36.0–46.0)
Hemoglobin: 13.1 g/dL (ref 12.0–15.0)
MCHC: 32.9 g/dL (ref 30.0–36.0)
MCV: 92.1 fl (ref 78.0–100.0)
Platelets: 285 10*3/uL (ref 150.0–400.0)
RBC: 4.31 Mil/uL (ref 3.87–5.11)
RDW: 12.8 % (ref 11.5–15.5)
WBC: 6.8 10*3/uL (ref 4.0–10.5)

## 2017-05-11 LAB — LIPID PANEL
Cholesterol: 195 mg/dL (ref 0–200)
HDL: 43.3 mg/dL (ref >39.00)
NONHDL: 151.74
Total CHOL/HDL Ratio: 5
Triglycerides: 290 mg/dL — ABNORMAL HIGH (ref 0.0–149.0)
VLDL: 58 mg/dL — AB (ref 0.0–40.0)

## 2017-05-11 LAB — VITAMIN D 25 HYDROXY (VIT D DEFICIENCY, FRACTURES): VITD: 14.3 ng/mL — AB (ref 30.00–100.00)

## 2017-05-11 LAB — LDL CHOLESTEROL, DIRECT: LDL DIRECT: 130 mg/dL

## 2017-05-11 NOTE — Patient Instructions (Signed)
Health Maintenance for Postmenopausal Women Menopause is a normal process in which your reproductive ability comes to an end. This process happens gradually over a span of months to years, usually between the ages of 22 and 9. Menopause is complete when you have missed 12 consecutive menstrual periods. It is important to talk with your health care provider about some of the most common conditions that affect postmenopausal women, such as heart disease, cancer, and bone loss (osteoporosis). Adopting a healthy lifestyle and getting preventive care can help to promote your health and wellness. Those actions can also lower your chances of developing some of these common conditions. What should I know about menopause? During menopause, you may experience a number of symptoms, such as:  Moderate-to-severe hot flashes.  Night sweats.  Decrease in sex drive.  Mood swings.  Headaches.  Tiredness.  Irritability.  Memory problems.  Insomnia.  Choosing to treat or not to treat menopausal changes is an individual decision that you make with your health care provider. What should I know about hormone replacement therapy and supplements? Hormone therapy products are effective for treating symptoms that are associated with menopause, such as hot flashes and night sweats. Hormone replacement carries certain risks, especially as you become older. If you are thinking about using estrogen or estrogen with progestin treatments, discuss the benefits and risks with your health care provider. What should I know about heart disease and stroke? Heart disease, heart attack, and stroke become more likely as you age. This may be due, in part, to the hormonal changes that your body experiences during menopause. These can affect how your body processes dietary fats, triglycerides, and cholesterol. Heart attack and stroke are both medical emergencies. There are many things that you can do to help prevent heart disease  and stroke:  Have your blood pressure checked at least every 1-2 years. High blood pressure causes heart disease and increases the risk of stroke.  If you are 59-22 years old, ask your health care provider if you should take aspirin to prevent a heart attack or a stroke.  Do not use any tobacco products, including cigarettes, chewing tobacco, or electronic cigarettes. If you need help quitting, ask your health care provider.  It is important to eat a healthy diet and maintain a healthy weight. ? Be sure to include plenty of vegetables, fruits, low-fat dairy products, and lean protein. ? Avoid eating foods that are high in solid fats, added sugars, or salt (sodium).  Get regular exercise. This is one of the most important things that you can do for your health. ? Try to exercise for at least 150 minutes each week. The type of exercise that you do should increase your heart rate and make you sweat. This is known as moderate-intensity exercise. ? Try to do strengthening exercises at least twice each week. Do these in addition to the moderate-intensity exercise.  Know your numbers.Ask your health care provider to check your cholesterol and your blood glucose. Continue to have your blood tested as directed by your health care provider.  What should I know about cancer screening? There are several types of cancer. Take the following steps to reduce your risk and to catch any cancer development as early as possible. Breast Cancer  Practice breast self-awareness. ? This means understanding how your breasts normally appear and feel. ? It also means doing regular breast self-exams. Let your health care provider know about any changes, no matter how small.  If you are 59  or older, have a clinician do a breast exam (clinical breast exam or CBE) every year. Depending on your age, family history, and medical history, it may be recommended that you also have a yearly breast X-ray (mammogram).  If you  have a family history of breast cancer, talk with your health care provider about genetic screening.  If you are at high risk for breast cancer, 59 talk with your health care provider about having an MRI and a mammogram every year.  Breast cancer (BRCA) gene test is recommended for women who have family members with BRCA-related cancers. Results of the assessment will determine the need for genetic counseling and BRCA1 and for BRCA2 testing. BRCA-related cancers include these types: ? Breast. This occurs in males or females. ? Ovarian. ? Tubal. This may also be called fallopian tube cancer. ? Cancer of the abdominal or pelvic lining (peritoneal cancer). ? Prostate. ? Pancreatic.  Cervical, Uterine, and Ovarian Cancer Your health care provider may recommend that you be screened regularly for cancer of the pelvic organs. These include your ovaries, uterus, and vagina. This screening involves a pelvic exam, which includes checking for microscopic changes to the surface of your cervix (Pap test).  For women ages 59-65, health care providers may recommend a pelvic exam and a Pap test every three years. For women ages 59-65, they may recommend the Pap test and pelvic exam, combined with testing for human papilloma virus (HPV), every five years. Some types of HPV increase your risk of cervical cancer. Testing for HPV may also be done on women of any age who have unclear Pap test results.  Other health care providers may not recommend any screening for nonpregnant women who are considered low risk for pelvic cancer and have no symptoms. Ask your health care provider if a screening pelvic exam is right for you.  If you have had past treatment for cervical cancer or a condition that could lead to cancer, you need Pap tests and screening for cancer for at least 20 years after your treatment. If Pap tests have been discontinued for you, your risk factors (such as having a new sexual partner) need to be  reassessed to determine if you should start having screenings again. Some women have medical problems that increase the chance of getting cervical cancer. In these cases, your health care provider may recommend that you have screening and Pap tests more often.  If you have a family history of uterine cancer or ovarian cancer, talk with your health care provider about genetic screening.  If you have vaginal bleeding after reaching menopause, tell your health care provider.  There are currently no reliable tests available to screen for ovarian cancer.  Lung Cancer Lung cancer screening is recommended for adults 69-62 years old who are at high risk for lung cancer because of a history of smoking. A yearly low-dose CT scan of the lungs is recommended if you:  Currently smoke.  Have a history of at least 30 pack-years of smoking and you currently smoke or have quit within the past 15 years. A pack-year is smoking an average of one pack of cigarettes per day for one year.  Yearly screening should:  Continue until it has been 15 years since you quit.  Stop if you develop a health problem that would prevent you from having lung cancer treatment.  Colorectal Cancer  This type of cancer can be detected and can often be prevented.  Routine colorectal cancer screening usually begins at  age 42 and continues through age 45.  If you have risk factors for colon cancer, your health care provider may recommend that you be screened at an earlier age.  If you have a family history of colorectal cancer, talk with your health care provider about genetic screening.  Your health care provider may also recommend using home test kits to check for hidden blood in your stool.  A small camera at the end of a tube can be used to examine your colon directly (sigmoidoscopy or colonoscopy). This is done to check for the earliest forms of colorectal cancer.  Direct examination of the colon should be repeated every  5-10 years until age 71. However, if early forms of precancerous polyps or small growths are found or if you have a family history or genetic risk for colorectal cancer, you may need to be screened more often.  Skin Cancer  Check your skin from head to toe regularly.  Monitor any moles. Be sure to tell your health care provider: ? About any new moles or changes in moles, especially if there is a change in a mole's shape or color. ? If you have a mole that is larger than the size of a pencil eraser.  If any of your family members has a history of skin cancer, especially at a young age, talk with your health care provider about genetic screening.  Always use sunscreen. Apply sunscreen liberally and repeatedly throughout the day.  Whenever you are outside, protect yourself by wearing long sleeves, pants, a wide-brimmed hat, and sunglasses.  What should I know about osteoporosis? Osteoporosis is a condition in which bone destruction happens more quickly than new bone creation. After menopause, you may be at an increased risk for osteoporosis. To help prevent osteoporosis or the bone fractures that can happen because of osteoporosis, the following is recommended:  If you are 46-71 years old, get at least 1,000 mg of calcium and at least 600 mg of vitamin D per day.  If you are older than age 55 but younger than age 65, get at least 1,200 mg of calcium and at least 600 mg of vitamin D per day.  If you are older than age 54, get at least 1,200 mg of calcium and at least 800 mg of vitamin D per day.  Smoking and excessive alcohol intake increase the risk of osteoporosis. Eat foods that are rich in calcium and vitamin D, and do weight-bearing exercises several times each week as directed by your health care provider. What should I know about how menopause affects my mental health? Depression may occur at any age, but it is more common as you become older. Common symptoms of depression  include:  Low or sad mood.  Changes in sleep patterns.  Changes in appetite or eating patterns.  Feeling an overall lack of motivation or enjoyment of activities that you previously enjoyed.  Frequent crying spells.  Talk with your health care provider if you think that you are experiencing depression. What should I know about immunizations? It is important that you get and maintain your immunizations. These include:  Tetanus, diphtheria, and pertussis (Tdap) booster vaccine.  Influenza every year before the flu season begins.  Pneumonia vaccine.  Shingles vaccine.  Your health care provider may also recommend other immunizations. This information is not intended to replace advice given to you by your health care provider. Make sure you discuss any questions you have with your health care provider. Document Released: 06/03/2005  Document Revised: 10/30/2015 Document Reviewed: 01/13/2015 Elsevier Interactive Patient Education  2018 [employer]  

## 2017-05-11 NOTE — Assessment & Plan Note (Signed)
CMET and lipid profile today Encouraged her to consume a low fat diet 

## 2017-05-11 NOTE — Progress Notes (Signed)
Subjective:    Patient ID: Hannah Brooks, female    DOB: 01-26-1961, 57 y.o.   MRN: [id number]  HPI  Pt presents to the clinic today for her annual exam. She is also due to follow up chronic conditions.  HTN: Her BP today is 124/70. She is not taking Azilsartan, because she has been out 4 months. There is no ECG on file.  HLD: Her last triglycerides were 215, LDL 43, 04/2015. She is not taking Zetia because she has been out for 4 months.  She reports intermittent reflux, triggered by caffeine. She does not take anything OTC for this, she just limits her caffeine.  Flu: never Tetanus: unsure Mammogram: > 2 years ago Pap Smear: about 4 years ago Colon Screening: 2015, 10 years ago Vision Screening: annually Dentist: annually  Diet: She does eat lean meat. She consumes fruits and veggies daily. She does eat fried foods. She drinks mostly water. Exercise: Treadmill for 30 minutes daily.  Review of Systems  Past Medical History:  Diagnosis Date  . Hypertension     Current Outpatient Medications  Medication Sig Dispense Refill  . Azilsartan Medoxomil 80 MG TABS Take 0.5 tablets (40 mg total) by mouth daily. 30 tablet 5  . ezetimibe (ZETIA) 10 MG tablet Take 1 tablet (10 mg total) by mouth daily. (Patient not taking: Reported on 05/11/2017) 90 tablet 3   No current facility-administered medications for this visit.     No Known Allergies  Family History  Problem Relation Age of Onset  . Hypertension Mother   . Heart disease Father   . Cancer Neg Hx     Social History   Socioeconomic History  . Marital status: Married    Spouse name: Not on file  . Number of children: Not on file  . Years of education: Not on file  . Highest education level: Not on file  Social Needs  . Financial resource strain: Not on file  . Food insecurity - worry: Not on file  . Food insecurity - inability: Not on file  . Transportation needs - medical: Not on file  . Transportation needs -  non-medical: Not on file  Occupational History  . Not on file  Tobacco Use  . Smoking status: Never Smoker  . Smokeless tobacco: Never Used  Substance and Sexual Activity  . Alcohol use: Yes    Comment: occasional  . Drug use: No  . Sexual activity: Not on file  Other Topics Concern  . Not on file  Social History Narrative  . Not on file     Constitutional: Denies fever, malaise, fatigue, headache or abrupt weight changes.  HEENT: Denies eye pain, eye redness, ear pain, ringing in the ears, wax buildup, runny nose, nasal congestion, bloody nose, or sore throat. Respiratory: Denies difficulty breathing, shortness of breath, cough or sputum production.   Cardiovascular: Denies chest pain, chest tightness, palpitations or swelling in the hands or feet.  Gastrointestinal: Pt reports intermittent reflux. Denies abdominal pain, bloating, constipation, diarrhea or blood in the stool.  GU: Denies urgency, frequency, pain with urination, burning sensation, blood in urine, odor or discharge. Musculoskeletal: Denies decrease in range of motion, difficulty with gait, muscle pain or joint pain and swelling.  Skin: Denies redness, rashes, lesions or ulcercations.  Neurological: Denies dizziness, difficulty with memory, difficulty with speech or problems with balance and coordination.  Psych: Denies anxiety, depression, SI/HI.  No other specific complaints in a complete review of systems (except as listed  in HPI above).     Objective:   Physical Exam   BP 124/70   Pulse 83   Temp 98.1 F (36.7 C) (Oral)   Wt 123 lb 4 oz (55.9 kg)   SpO2 97%   BMI 23.81 kg/m  Wt Readings from Last 3 Encounters:  05/11/17 123 lb 4 oz (55.9 kg)  08/21/15 131 lb (59.4 kg)  07/14/15 131 lb (59.4 kg)    General: Appears her stated age, well developed, well nourished in NAD. Skin: Warm, dry and intact.  HEENT: Head: normal shape and size; Eyes: sclera white, no icterus, conjunctiva pink, PERRLA and EOMs  intact; Ears: Tm's gray and intact, normal light reflex; Throat/Mouth: Teeth present, mucosa pink and moist, no exudate, lesions or ulcerations noted.  Neck:  Neck supple, trachea midline. No masses, lumps or thyromegaly present.  Cardiovascular: Normal rate and rhythm. S1,S2 noted.  No murmur, rubs or gallops noted. No JVD or BLE edema. No carotid bruits noted. Pulmonary/Chest: Normal effort and positive vesicular breath sounds. No respiratory distress. No wheezes, rales or ronchi noted.  Abdomen: Soft and nontender. Normal bowel sounds. No distention or masses noted. Liver, spleen and kidneys non palpable. Musculoskeletal: Strength 5/5 BUE/BLE. No difficulty with gait.  Neurological: Alert and oriented. Cranial nerves II-XII grossly intact. Coordination normal.  Psychiatric: Mood and affect normal. Behavior is normal. Judgment and thought content normal.     BMET    Component Value Date/Time   NA 138 02/10/2015 1456   K 4.1 02/10/2015 1456   CL 104 02/10/2015 1456   CO2 29 02/10/2015 1456   GLUCOSE 101 (H) 02/10/2015 1456   BUN 24 (H) 02/10/2015 1456   CREATININE 0.82 02/10/2015 1456   CALCIUM 9.6 02/10/2015 1456    Lipid Panel     Component Value Date/Time   CHOL 196 05/15/2015 1604   TRIG 215 (H) 05/15/2015 1604   HDL 41 (L) 05/15/2015 1604   CHOLHDL 4.8 05/15/2015 1604   VLDL 43 (H) 05/15/2015 1604   LDLCALC 112 05/15/2015 1604    CBC    Component Value Date/Time   WBC 9.6 02/10/2015 1456   RBC 4.52 02/10/2015 1456   HGB 13.3 02/10/2015 1456   HCT 40.5 02/10/2015 1456   PLT 298.0 02/10/2015 1456   MCV 89.6 02/10/2015 1456   MCHC 32.9 02/10/2015 1456   RDW 12.4 02/10/2015 1456    Hgb A1C No results found for: HGBA1C         Assessment & Plan:   Preventative Health Maintenance:  Flu shot today She declines tetanus or mammogram Pap smear UTD Colon Screening UTD Encouraged her to consume a balanced diet and exercise regimen Advised her to see an eye  doctor and dentist annually Will check CBC, CMET, Lipid and Vit D today  RTC in 1 year, sooner if needed Webb Silversmith, NP

## 2017-05-11 NOTE — Assessment & Plan Note (Signed)
Controlled off meds CBC and CMET today Discussed DASH diet and exercise for weight loss

## 2017-05-22 MED ORDER — FENOFIBRATE 54 MG PO TABS: 54.0000 mg | ORAL_TABLET | Freq: Every day | ORAL | 3 refills | Status: DC

## 2017-05-22 MED ORDER — VITAMIN D (ERGOCALCIFEROL) 1.25 MG (50000 UNIT) PO CAPS: 50000.0000 [IU] | ORAL_CAPSULE | ORAL | 0 refills | Status: DC

## 2017-05-22 NOTE — Addendum Note (Signed)
Addended by: Lurlean Nanny on: 05/22/2017 04:16 PM   Modules accepted: Orders

## 2017-05-23 ENCOUNTER — Encounter: Payer: Self-pay | Admitting: Internal Medicine

## 2017-05-23 ENCOUNTER — Ambulatory Visit: Payer: 59

## 2017-05-23 NOTE — Addendum Note (Signed)
Addended by: Lurlean Nanny on: 05/23/2017 04:40 PM   Modules accepted: Orders

## 2017-06-14 ENCOUNTER — Other Ambulatory Visit: Payer: Self-pay | Admitting: Internal Medicine

## 2017-07-03 ENCOUNTER — Encounter: Payer: 59 | Admitting: Internal Medicine

## 2017-09-11 ENCOUNTER — Other Ambulatory Visit (INDEPENDENT_AMBULATORY_CARE_PROVIDER_SITE_OTHER): Payer: 59

## 2017-09-11 DIAGNOSIS — E559 Vitamin D deficiency, unspecified: Secondary | ICD-10-CM | POA: Diagnosis not present

## 2017-09-11 DIAGNOSIS — E781 Pure hyperglyceridemia: Secondary | ICD-10-CM

## 2017-09-11 LAB — VITAMIN D 25 HYDROXY (VIT D DEFICIENCY, FRACTURES): VITD: 22.44 ng/mL — ABNORMAL LOW (ref 30.00–100.00)

## 2017-09-13 ENCOUNTER — Other Ambulatory Visit: Payer: Self-pay | Admitting: Internal Medicine

## 2017-09-13 NOTE — Addendum Note (Signed)
Addended by: Lurlean Nanny on: 09/13/2017 04:08 PM   Modules accepted: Orders

## 2017-09-14 MED ORDER — VITAMIN D (ERGOCALCIFEROL) 1.25 MG (50000 UNIT) PO CAPS: 50000.0000 [IU] | ORAL_CAPSULE | ORAL | 0 refills | Status: DC

## 2017-09-14 NOTE — Addendum Note (Signed)
Addended by: Lurlean Nanny on: 09/14/2017 08:24 AM   Modules accepted: Orders

## 2017-09-14 NOTE — Addendum Note (Signed)
Addended by: Lurlean Nanny on: 09/14/2017 02:01 PM   Modules accepted: Orders

## 2019-01-18 ENCOUNTER — Ambulatory Visit (INDEPENDENT_AMBULATORY_CARE_PROVIDER_SITE_OTHER): Payer: 59 | Admitting: Internal Medicine

## 2019-01-18 ENCOUNTER — Ambulatory Visit (INDEPENDENT_AMBULATORY_CARE_PROVIDER_SITE_OTHER)
Admission: RE | Admit: 2019-01-18 | Discharge: 2019-01-18 | Disposition: A | Discharge status: Home / Self Care | Payer: 59 | Source: Ambulatory Visit | Attending: Internal Medicine | Admitting: Internal Medicine

## 2019-01-18 ENCOUNTER — Other Ambulatory Visit: Payer: Self-pay

## 2019-01-18 ENCOUNTER — Encounter: Payer: Self-pay | Admitting: Internal Medicine

## 2019-01-18 VITALS — BP 120/78 | HR 73 | Temp 98.0°F | Ht 60.0 in | Wt 133.0 lb

## 2019-01-18 DIAGNOSIS — Z23 Encounter for immunization: Secondary | ICD-10-CM

## 2019-01-18 DIAGNOSIS — E781 Pure hyperglyceridemia: Secondary | ICD-10-CM | POA: Diagnosis not present

## 2019-01-18 DIAGNOSIS — Z1159 Encounter for screening for other viral diseases: Secondary | ICD-10-CM

## 2019-01-18 DIAGNOSIS — Z0000 Encounter for general adult medical examination without abnormal findings: Secondary | ICD-10-CM

## 2019-01-18 DIAGNOSIS — Z114 Encounter for screening for human immunodeficiency virus [HIV]: Secondary | ICD-10-CM | POA: Diagnosis not present

## 2019-01-18 DIAGNOSIS — M79671 Pain in right foot: Secondary | ICD-10-CM

## 2019-01-18 DIAGNOSIS — I10 Essential (primary) hypertension: Secondary | ICD-10-CM | POA: Diagnosis not present

## 2019-01-18 NOTE — Assessment & Plan Note (Signed)
CMET and Lipid profile today Encouraged her to consume a low fat diet 

## 2019-01-18 NOTE — Progress Notes (Signed)
Subjective:    Patient ID: Hannah Brooks, female    DOB: [birth year], 58 y.o.   MRN: [id number]  HPI  Pt presents to the clinic today for her annual exam. She is also due to follow up chronic conditions.  HTN: Her BP today is 120/78. She is not taking any antihypertensive medications at this time. There is no ECG on file.  HLD: Her last LDL was 130, triglycerides 290, 04/2017. She is not taking Fenofibrate as prescribed. She tries to consume a low fat diet.  Flu: 04/2017 Tetanus: unsure Shingrix: never Mammogram: > 2 years ago Pap Smear: 5 years ago Bone Density: never Colon Screening: 2015, 10 years per her report Vision Screening: as needed Dentist: biannually  Diet: She does eat meat. She consumes some fruits and veggies. She does eat some fried food. She drinks mostly water, soda. Exercise: None  Review of Systems      Past Medical History:  Diagnosis Date  . Hypertension     Current Outpatient Medications  Medication Sig Dispense Refill  . fenofibrate 54 MG tablet TAKE 1 TABLET BY MOUTH DAILY 30 tablet 2  . Vitamin D, Ergocalciferol, (DRISDOL) 50000 units CAPS capsule Take 1 capsule (50,000 Units total) by mouth every 7 (seven) days. 12 capsule 0   No current facility-administered medications for this visit.     No Known Allergies  Family History  Problem Relation Age of Onset  . Hypertension Mother   . Heart disease Father   . Cancer Neg Hx     Social History   Socioeconomic History  . Marital status: Married    Spouse name: Not on file  . Number of children: Not on file  . Years of education: Not on file  . Highest education level: Not on file  Occupational History  . Not on file  Social Needs  . Financial resource strain: Not on file  . Food insecurity    Worry: Not on file    Inability: Not on file  . Transportation needs    Medical: Not on file    Non-medical: Not on file  Tobacco Use  . Smoking status: Never Smoker  . Smokeless tobacco:  Never Used  Substance and Sexual Activity  . Alcohol use: Yes    Comment: occasional  . Drug use: No  . Sexual activity: Not on file  Lifestyle  . Physical activity    Days per week: Not on file    Minutes per session: Not on file  . Stress: Not on file  Relationships  . Social Herbalist on phone: Not on file    Gets together: Not on file    Attends religious service: Not on file    Active member of club or organization: Not on file    Attends meetings of clubs or organizations: Not on file    Relationship status: Not on file  . Intimate partner violence    Fear of current or ex partner: Not on file    Emotionally abused: Not on file    Physically abused: Not on file    Forced sexual activity: Not on file  Other Topics Concern  . Not on file  Social History Narrative  . Not on file     Constitutional: Denies fever, malaise, fatigue, headache or abrupt weight changes.  HEENT: Denies eye pain, eye redness, ear pain, ringing in the ears, wax buildup, runny nose, nasal congestion, bloody nose, or sore throat. Respiratory: Denies  difficulty breathing, shortness of breath, cough or sputum production.   Cardiovascular: Denies chest pain, chest tightness, palpitations or swelling in the hands or feet.  Gastrointestinal: Denies abdominal pain, bloating, constipation, diarrhea or blood in the stool.  GU: Denies urgency, frequency, pain with urination, burning sensation, blood in urine, odor or discharge. Musculoskeletal: Pt reports right foot pain, bilateral wrist pain, right knee pain. Denies decrease in range of motion, difficulty with gait, muscle pain or joint swelling.  Skin: Denies redness, rashes, lesions or ulcercations.  Neurological: Denies dizziness, difficulty with memory, difficulty with speech or problems with balance and coordination.  Psych: Denies anxiety, depression, SI/HI.  No other specific complaints in a complete review of systems (except as listed in  HPI above).  Objective:   Physical Exam   BP 120/78   Pulse 73   Temp 98 F (36.7 C) (Temporal)   Ht 5' (1.524 m)   Wt 133 lb (60.3 kg)   SpO2 98%   BMI 25.97 kg/m  Wt Readings from Last 3 Encounters:  01/18/19 133 lb (60.3 kg)  05/11/17 123 lb 4 oz (55.9 kg)  08/21/15 131 lb (59.4 kg)    General: Appears her stated age, well developed, well nourished in NAD. Skin: Warm, dry and intact. No rashes noted. HEENT: Head: normal shape and size; Eyes: sclera white, no icterus, conjunctiva pink, PERRLA and EOMs intact; Ears: Tm's gray and intact, normal light reflex;  Neck:  Neck supple, trachea midline. No masses, lumps or thyromegaly present.  Cardiovascular: Normal rate and rhythm. S1,S2 noted.  No murmur, rubs or gallops noted. No JVD or BLE edema. No carotid bruits noted. Pulmonary/Chest: Normal effort and positive vesicular breath sounds. No respiratory distress. No wheezes, rales or ronchi noted.  Abdomen: Soft and nontender. Normal bowel sounds. No distention or masses noted. Liver, spleen and kidneys non palpable. Musculoskeletal: Pain with palpation of arch of right foot. Strength 5/5 BUE/BLE. No difficulty with gait.  Neurological: Alert and oriented. Cranial nerves II-XII grossly intact. Coordination normal.  Psychiatric: Mood and affect normal. Behavior is normal. Judgment and thought content normal.     BMET    Component Value Date/Time   NA 140 05/11/2017 1024   K 3.9 05/11/2017 1024   CL 105 05/11/2017 1024   CO2 29 05/11/2017 1024   GLUCOSE 132 (H) 05/11/2017 1024   BUN 19 05/11/2017 1024   CREATININE 0.61 05/11/2017 1024   CALCIUM 8.9 05/11/2017 1024    Lipid Panel     Component Value Date/Time   CHOL 195 05/11/2017 1024   TRIG 290.0 (H) 05/11/2017 1024   HDL 43.30 05/11/2017 1024   CHOLHDL 5 05/11/2017 1024   VLDL 58.0 (H) 05/11/2017 1024   LDLCALC 112 05/15/2015 1604    CBC    Component Value Date/Time   WBC 6.8 05/11/2017 1024   RBC 4.31  05/11/2017 1024   HGB 13.1 05/11/2017 1024   HCT 39.7 05/11/2017 1024   PLT 285.0 05/11/2017 1024   MCV 92.1 05/11/2017 1024   MCHC 32.9 05/11/2017 1024   RDW 12.8 05/11/2017 1024    Hgb A1C No results found for: HGBA1C         Assessment & Plan:   Preventative Health Maintenance:  Flu shot today Tdap UTD She declines Shingrix She declines pap smear She declines mammogram She declines bone density exam Colon screening UTD Encouraged her to consume a balanced diet and exercise regimen Advised her to see an eye doctor and dentist annually  Will check CBC, CMET, Lipid, Vit D, HIV and Hep C today  Right Foot Pain:  Will obtain xray right foot today  RTC in 1 year, sooner if needed

## 2019-01-18 NOTE — Patient Instructions (Signed)

## 2019-01-18 NOTE — Assessment & Plan Note (Signed)
Controlled off meds  Will monitor 

## 2019-01-21 LAB — CBC
HCT: 39.3 % (ref 35.0–45.0)
Hemoglobin: 13 g/dL (ref 11.7–15.5)
MCH: 30 pg (ref 27.0–33.0)
MCHC: 33.1 g/dL (ref 32.0–36.0)
MCV: 90.8 fL (ref 80.0–100.0)
MPV: 9.5 fL (ref 7.5–12.5)
Platelets: 279 10*3/uL (ref 140–400)
RBC: 4.33 10*6/uL (ref 3.80–5.10)
RDW: 11.8 % (ref 11.0–15.0)
WBC: 9.2 10*3/uL (ref 3.8–10.8)

## 2019-01-21 LAB — COMPREHENSIVE METABOLIC PANEL
AG Ratio: 1.4 (calc) (ref 1.0–2.5)
ALT: 31 U/L — ABNORMAL HIGH (ref 6–29)
AST: 23 U/L (ref 10–35)
Albumin: 4.1 g/dL (ref 3.6–5.1)
Alkaline phosphatase (APISO): 92 U/L (ref 37–153)
BUN/Creatinine Ratio: 23 (calc) — ABNORMAL HIGH (ref 6–22)
BUN: 24 mg/dL (ref 7–25)
CO2: 25 mmol/L (ref 20–32)
Calcium: 8.8 mg/dL (ref 8.6–10.4)
Chloride: 105 mmol/L (ref 98–110)
Creat: 1.03 mg/dL — ABNORMAL HIGH (ref 0.50–0.99)
Globulin: 2.9 g/dL (calc) (ref 1.9–3.7)
Glucose, Bld: 103 mg/dL — ABNORMAL HIGH (ref 65–99)
Potassium: 3.9 mmol/L (ref 3.5–5.3)
Sodium: 140 mmol/L (ref 135–146)
Total Bilirubin: 0.5 mg/dL (ref 0.2–1.2)
Total Protein: 7 g/dL (ref 6.1–8.1)

## 2019-01-21 LAB — LIPID PANEL
Cholesterol: 194 mg/dL (ref <200)
HDL: 41 mg/dL — ABNORMAL LOW (ref >=50)
LDL Cholesterol (Calc): 102 mg/dL (calc) — ABNORMAL HIGH
Non-HDL Cholesterol (Calc): 153 mg/dL (calc) — ABNORMAL HIGH (ref <130)
Total CHOL/HDL Ratio: 4.7 (calc) (ref <5.0)
Triglycerides: 385 mg/dL — ABNORMAL HIGH (ref <150)

## 2019-01-21 LAB — VITAMIN D 25 HYDROXY (VIT D DEFICIENCY, FRACTURES): Vit D, 25-Hydroxy: 28 ng/mL — ABNORMAL LOW (ref 30–100)

## 2019-01-21 LAB — HEPATITIS C ANTIBODY
Hepatitis C Ab: NONREACTIVE
SIGNAL TO CUT-OFF: 0.03 (ref <1.00)

## 2019-01-21 LAB — HIV ANTIBODY (ROUTINE TESTING W REFLEX): HIV 1&2 Ab, 4th Generation: NONREACTIVE

## 2019-01-24 MED ORDER — FENOFIBRATE 54 MG PO TABS: 54.0000 mg | ORAL_TABLET | Freq: Every day | ORAL | 5 refills | Status: DC

## 2019-01-24 NOTE — Addendum Note (Signed)
Addended by: Lurlean Nanny on: 01/24/2019 05:34 PM   Modules accepted: Orders

## 2019-02-26 NOTE — Addendum Note (Signed)
Addended by: Lurlean Nanny on: 02/26/2019 02:39 PM   Modules accepted: Orders

## 2019-03-25 ENCOUNTER — Other Ambulatory Visit: Payer: Self-pay

## 2019-03-25 DIAGNOSIS — Z20822 Contact with and (suspected) exposure to covid-19: Secondary | ICD-10-CM

## 2019-03-25 DIAGNOSIS — U071 COVID-19: Secondary | ICD-10-CM

## 2019-03-27 LAB — NOVEL CORONAVIRUS, NAA: SARS-CoV-2, NAA: DETECTED — AB

## 2019-03-28 ENCOUNTER — Telehealth: Payer: Self-pay | Admitting: Nurse Practitioner

## 2019-03-28 NOTE — Addendum Note (Signed)
Addended by: Lurlean Nanny on: 03/28/2019 01:06 PM   Modules accepted: Orders

## 2019-03-28 NOTE — Telephone Encounter (Signed)
Called to Discuss with patient about Covid symptoms and the use of bamlanivimab, a monoclonal antibody infusion for those with mild to moderate Covid symptoms and at a high risk of hospitalization.     Pt is qualified for this infusion at the [facility] infusion center due to co-morbid conditions and/or a member of an at-risk group.   Patient is currently being managed for the following:  Patient Active Problem List   Diagnosis Date Noted  . Hypertriglyceridemia 05/11/2017  . Essential hypertension 12/23/2013     Patient declines treatment at this time.

## 2019-04-01 NOTE — Addendum Note (Signed)
Addended by: Lurlean Nanny on: 04/01/2019 04:34 PM   Modules accepted: Orders

## 2019-05-03 ENCOUNTER — Other Ambulatory Visit: Payer: Self-pay

## 2019-05-03 ENCOUNTER — Emergency Department
Admission: EM | Admit: 2019-05-03 | Discharge: 2019-05-03 | Disposition: A | Discharge status: Home / Self Care | Payer: 59 | Attending: Student in an Organized Health Care Education/Training Program | Admitting: Student in an Organized Health Care Education/Training Program

## 2019-05-03 ENCOUNTER — Encounter: Payer: Self-pay | Admitting: Emergency Medicine

## 2019-05-03 ENCOUNTER — Emergency Department: Payer: 59

## 2019-05-03 DIAGNOSIS — I159 Secondary hypertension, unspecified: Secondary | ICD-10-CM

## 2019-05-03 DIAGNOSIS — R519 Headache, unspecified: Secondary | ICD-10-CM | POA: Insufficient documentation

## 2019-05-03 DIAGNOSIS — Z79899 Other long term (current) drug therapy: Secondary | ICD-10-CM | POA: Insufficient documentation

## 2019-05-03 DIAGNOSIS — I10 Essential (primary) hypertension: Secondary | ICD-10-CM | POA: Insufficient documentation

## 2019-05-03 LAB — BASIC METABOLIC PANEL
Anion gap: 10 (ref 5–15)
BUN: 26 mg/dL — ABNORMAL HIGH (ref 6–20)
CO2: 26 mmol/L (ref 22–32)
Calcium: 10.1 mg/dL (ref 8.9–10.3)
Chloride: 104 mmol/L (ref 98–111)
Creatinine, Ser: 1.27 mg/dL — ABNORMAL HIGH (ref 0.44–1.00)
GFR calc Af Amer: 53 mL/min — ABNORMAL LOW (ref >60)
GFR calc non Af Amer: 46 mL/min — ABNORMAL LOW (ref >60)
Glucose, Bld: 130 mg/dL — ABNORMAL HIGH (ref 70–99)
Potassium: 3.7 mmol/L (ref 3.5–5.1)
Sodium: 140 mmol/L (ref 135–145)

## 2019-05-03 LAB — CBC
HCT: 38.5 % (ref 36.0–46.0)
Hemoglobin: 12.8 g/dL (ref 12.0–15.0)
MCH: 29.8 pg (ref 26.0–34.0)
MCHC: 33.2 g/dL (ref 30.0–36.0)
MCV: 89.5 fL (ref 80.0–100.0)
Platelets: 297 10*3/uL (ref 150–400)
RBC: 4.3 MIL/uL (ref 3.87–5.11)
RDW: 12 % (ref 11.5–15.5)
WBC: 9.4 10*3/uL (ref 4.0–10.5)
nRBC: 0 % (ref 0.0–0.2)

## 2019-05-03 MED ORDER — IOHEXOL 350 MG/ML SOLN
75.0000 mL | Freq: Once | INTRAVENOUS | Status: AC | PRN
  Administered 2019-05-03: 100 mL via INTRAVENOUS
  Filled 2019-05-03: qty 75

## 2019-05-03 MED ORDER — DEXAMETHASONE SODIUM PHOSPHATE 10 MG/ML IJ SOLN
10.0000 mg | Freq: Once | INTRAMUSCULAR | Status: AC
  Administered 2019-05-03: 10 mg via INTRAVENOUS
  Filled 2019-05-03: qty 1

## 2019-05-03 MED ORDER — AMLODIPINE BESYLATE 5 MG PO TABS
5.0000 mg | ORAL_TABLET | Freq: Once | ORAL | Status: AC
  Administered 2019-05-03: 5 mg via ORAL
  Filled 2019-05-03: qty 1

## 2019-05-03 MED ORDER — AMLODIPINE BESYLATE 5 MG PO TABS: 5.0000 mg | ORAL_TABLET | Freq: Every day | ORAL | 0 refills | Status: DC

## 2019-05-03 MED ORDER — ACETAMINOPHEN 500 MG PO TABS
1000.0000 mg | ORAL_TABLET | Freq: Once | ORAL | Status: AC
  Administered 2019-05-03: 1000 mg via ORAL
  Filled 2019-05-03: qty 2

## 2019-05-03 MED ORDER — DIPHENHYDRAMINE HCL 50 MG/ML IJ SOLN
12.5000 mg | Freq: Once | INTRAMUSCULAR | Status: AC
  Administered 2019-05-03: 12.5 mg via INTRAVENOUS
  Filled 2019-05-03: qty 1

## 2019-05-03 MED ORDER — PROCHLORPERAZINE EDISYLATE 10 MG/2ML IJ SOLN
10.0000 mg | Freq: Once | INTRAMUSCULAR | Status: AC
  Administered 2019-05-03: 10 mg via INTRAVENOUS
  Filled 2019-05-03: qty 2

## 2019-05-03 NOTE — ED Provider Notes (Signed)
[facility] Emergency Department Provider Note    First MD Initiated Contact with Patient 05/03/19 1711     (approximate)  I have reviewed the triage vital signs and the nursing notes.   HISTORY  Chief Complaint Headache    HPI Hannah Brooks is a 59 y.o. female with the below listed past medical history presents the ER for evaluation of right-sided headache that is been ongoing for about a week.  States it was gradual in onset but is never had a headache like this before.  States is the worst headache of her life.  Denies any numbness or tingling with it.  Has tried some Advil without any improvement.  No history of migraine or tension.  No fevers.  No blurry vision.  States that she was previously on antihypertensive medications but had them continued last year after reassuring checkup.    Past Medical History:  Diagnosis Date  . Hypertension    Family History  Problem Relation Age of Onset  . Hypertension Mother   . Heart disease Father   . Cancer Neg Hx    History reviewed. No pertinent surgical history. Patient Active Problem List   Diagnosis Date Noted  . Hypertriglyceridemia 05/11/2017  . Essential hypertension 12/23/2013      Prior to Admission medications   Medication Sig Start Date End Date Taking? Authorizing Provider  amLODipine (NORVASC) 5 MG tablet Take 1 tablet (5 mg total) by mouth daily. 05/03/19 05/02/20  Merlyn Lot, MD  fenofibrate 54 MG tablet Take 1 tablet (54 mg total) by mouth daily. 01/24/19   Jearld Fenton, NP    Allergies Patient has no known allergies.    Social History Social History   Tobacco Use  . Smoking status: Never Smoker  . Smokeless tobacco: Never Used  Substance Use Topics  . Alcohol use: Yes    Comment: occasional  . Drug use: No    Review of Systems Patient denies headaches, rhinorrhea, blurry vision, numbness, shortness of breath, chest pain, edema, cough, abdominal pain, nausea,  vomiting, diarrhea, dysuria, fevers, rashes or hallucinations unless otherwise stated above in HPI. ____________________________________________   PHYSICAL EXAM:  VITAL SIGNS: Vitals:   05/03/19 1410 05/03/19 1953  BP: (!) 189/106 (!) 181/95  Pulse: 87 (!) 58  Resp: 18 20  Temp: 99 F (37.2 C)   SpO2: 95% 98%    Constitutional: Alert and oriented.  Eyes: Conjunctivae are normal.  Head: Atraumatic. Nose: No congestion/rhinnorhea. Mouth/Throat: Mucous membranes are moist.   Neck: No stridor. Painless ROM.  Cardiovascular: Normal rate, regular rhythm. Grossly normal heart sounds.  Good peripheral circulation. Respiratory: Normal respiratory effort.  No retractions. Lungs CTAB. Gastrointestinal: Soft and nontender. No distention. No abdominal bruits. No CVA tenderness. Genitourinary:  Musculoskeletal: No lower extremity tenderness nor edema.  No joint effusions. Neurologic:  Normal speech and language. No gross focal neurologic deficits are appreciated. No facial droop Skin:  Skin is warm, dry and intact. No rash noted. Psychiatric: Mood and affect are normal. Speech and behavior are normal.  ____________________________________________   LABS (all labs ordered are listed, but only abnormal results are displayed)  Results for orders placed or performed during the hospital encounter of 05/03/19 (from the past 24 hour(s))  Basic metabolic panel     Status: Abnormal   Collection Time: 05/03/19  2:20 PM  Result Value Ref Range   Sodium 140 135 - 145 mmol/L   Potassium 3.7 3.5 - 5.1 mmol/L   Chloride  104 98 - 111 mmol/L   CO2 26 22 - 32 mmol/L   Glucose, Bld 130 (H) 70 - 99 mg/dL   BUN 26 (H) 6 - 20 mg/dL   Creatinine, Ser 1.27 (H) 0.44 - 1.00 mg/dL   Calcium 10.1 8.9 - 10.3 mg/dL   GFR calc non Af Amer 46 (L) >60 mL/min   GFR calc Af Amer 53 (L) >60 mL/min   Anion gap 10 5 - 15  CBC     Status: None   Collection Time: 05/03/19  2:20 PM  Result Value Ref Range   WBC 9.4  4.0 - 10.5 K/uL   RBC 4.30 3.87 - 5.11 MIL/uL   Hemoglobin 12.8 12.0 - 15.0 g/dL   HCT 38.5 36.0 - 46.0 %   MCV 89.5 80.0 - 100.0 fL   MCH 29.8 26.0 - 34.0 pg   MCHC 33.2 30.0 - 36.0 g/dL   RDW 12.0 11.5 - 15.5 %   Platelets 297 150 - 400 K/uL   nRBC 0.0 0.0 - 0.2 %   ____________________________________________ ____________________________________________  RADIOLOGY  I personally reviewed all radiographic images ordered to evaluate for the above acute complaints and reviewed radiology reports and findings.  These findings were personally discussed with the patient.  Please see medical record for radiology report.  ____________________________________________   PROCEDURES  Procedure(s) performed:  Procedures    Critical Care performed: no ____________________________________________   INITIAL IMPRESSION / ASSESSMENT AND PLAN / ED COURSE  Pertinent labs & imaging results that were available during my care of the patient were reviewed by me and considered in my medical decision making (see chart for details).   DDX: tension, cluster, migraine, mass, cva, iph, sah  Hannah Brooks is a 59 y.o. who presents to the ED with symptoms as described above.  Patient clinically well-appearing but does report this is the worst headache of her life and is hypertensive.  Neuro exam is nonfocal and reassuring.  Initial CT imaging is reassuring.  Denies any SOB or chest pain.  Will provide symptomatic management but do also feel patient require CTA to evaluate and exclude aneurysm.  The patient will be placed on continuous pulse oximetry and telemetry for monitoring.  Laboratory evaluation will be sent to evaluate for the above complaints.         The patient was evaluated in Emergency Department today for the symptoms described in the history of present illness. He/she was evaluated in the context of the global COVID-19 pandemic, which necessitated consideration that the patient might be at  risk for infection with the SARS-CoV-2 virus that causes COVID-19. Institutional protocols and algorithms that pertain to the evaluation of patients at risk for COVID-19 are in a state of rapid change based on information released by regulatory bodies including the CDC and federal and state organizations. These policies and algorithms were followed during the patient's care in the ED.  As part of my medical decision making, I reviewed the following data within the [facility] notes reviewed and incorporated, Labs reviewed, notes from prior ED visits and [place] Controlled Substance Database   ____________________________________________   FINAL CLINICAL IMPRESSION(S) / ED DIAGNOSES  Final diagnoses:  Bad headache  Secondary hypertension      NEW MEDICATIONS STARTED DURING THIS VISIT:  New Prescriptions   AMLODIPINE (NORVASC) 5 MG TABLET    Take 1 tablet (5 mg total) by mouth daily.     Note:  This document was prepared using Dragon  voice recognition software and may include unintentional dictation errors.    Merlyn Lot, MD 05/03/19 2028

## 2019-05-03 NOTE — ED Notes (Signed)
Pt states has been off her BP meds since her annual physical. This RN spoke with Dr. Charna Archer regarding care, VORB for blood and imaging.

## 2019-05-03 NOTE — Discharge Instructions (Signed)

## 2019-05-03 NOTE — ED Notes (Signed)
First RN Note: Pt presents to ED via POV, ambulatory without difficulty at this time, A&O x4, c/o HA x 1 week. Pt also states she wants to be seen for rash on her back.

## 2019-08-31 ENCOUNTER — Other Ambulatory Visit: Payer: Self-pay | Admitting: Internal Medicine

## 2019-09-02 NOTE — Telephone Encounter (Signed)
Last visit with PCP 01/18/2019

## 2019-10-04 ENCOUNTER — Encounter: Payer: 59 | Admitting: Internal Medicine

## 2019-10-04 ENCOUNTER — Other Ambulatory Visit: Payer: Self-pay

## 2019-10-04 NOTE — Progress Notes (Signed)
Pt checked in for appt, then left Webb Silversmith, NP

## 2019-10-07 ENCOUNTER — Encounter: Payer: Self-pay | Admitting: Internal Medicine

## 2019-10-07 ENCOUNTER — Ambulatory Visit (INDEPENDENT_AMBULATORY_CARE_PROVIDER_SITE_OTHER): Payer: 59 | Admitting: Internal Medicine

## 2019-10-07 ENCOUNTER — Other Ambulatory Visit: Payer: Self-pay

## 2019-10-07 DIAGNOSIS — I10 Essential (primary) hypertension: Secondary | ICD-10-CM | POA: Diagnosis not present

## 2019-10-07 MED ORDER — AMLODIPINE BESY-BENAZEPRIL HCL 5-10 MG PO CAPS: 1.0000 | ORAL_CAPSULE | Freq: Every day | ORAL | 0 refills | Status: DC

## 2019-10-07 NOTE — Patient Instructions (Signed)

## 2019-10-07 NOTE — Progress Notes (Signed)
Subjective:    Patient ID: Hannah Brooks, female    DOB: Sep 13, 1960, 59 y.o.   MRN: [id number]  HPI  Pt presents to the clinic today for follow up of HTN. Her BP today is 170/96. She is prescribed Amlodipine but is not taking it as prescribed. There is no ECG on file.  Review of Systems      Past Medical History:  Diagnosis Date  . Hypertension     Current Outpatient Medications  Medication Sig Dispense Refill  . amLODipine (NORVASC) 5 MG tablet Take 1 tablet (5 mg total) by mouth daily. 30 tablet 0  . fenofibrate 54 MG tablet Take 1 tablet (54 mg total) by mouth daily. 30 tablet 5   No current facility-administered medications for this visit.    No Known Allergies  Family History  Problem Relation Age of Onset  . Hypertension Mother   . Heart disease Father   . Cancer Neg Hx     Social History   Socioeconomic History  . Marital status: Married    Spouse name: Not on file  . Number of children: Not on file  . Years of education: Not on file  . Highest education level: Not on file  Occupational History  . Not on file  Tobacco Use  . Smoking status: Never Smoker  . Smokeless tobacco: Never Used  Substance and Sexual Activity  . Alcohol use: Yes    Comment: occasional  . Drug use: No  . Sexual activity: Not on file  Other Topics Concern  . Not on file  Social History Narrative  . Not on file   Social Determinants of Health   Financial Resource Strain:   . Difficulty of Paying Living Expenses:   Food Insecurity:   . Worried About Charity fundraiser in the Last Year:   . Arboriculturist in the Last Year:   Transportation Needs:   . Film/video editor (Medical):   Marland Kitchen Lack of Transportation (Non-Medical):   Physical Activity:   . Days of Exercise per Week:   . Minutes of Exercise per Session:   Stress:   . Feeling of Stress :   Social Connections:   . Frequency of Communication with Friends and Family:   . Frequency of Social Gatherings with  Friends and Family:   . Attends Religious Services:   . Active Member of Clubs or Organizations:   . Attends Archivist Meetings:   Marland Kitchen Marital Status:   Intimate Partner Violence:   . Fear of Current or Ex-Partner:   . Emotionally Abused:   Marland Kitchen Physically Abused:   . Sexually Abused:      Constitutional: Denies fever, malaise, fatigue, headache or abrupt weight changes.  Respiratory: Denies difficulty breathing, shortness of breath, cough or sputum production.   Cardiovascular: Denies chest pain, chest tightness, palpitations or swelling in the hands or feet.  Neurological: Denies dizziness, difficulty with memory, difficulty with speech or problems with balance and coordination.    No other specific complaints in a complete review of systems (except as listed in HPI above).  Objective:   Physical Exam  BP (!) 170/96   Pulse 70   Temp 97.9 F (36.6 C) (Temporal)   Wt 127 lb (57.6 kg)   SpO2 97%   BMI 23.23 kg/m   Wt Readings from Last 3 Encounters:  05/03/19 137 lb (62.1 kg)  01/18/19 133 lb (60.3 kg)  05/11/17 123 lb 4 oz (55.9 kg)  General: Appears her stated age, well developed, well nourished in NAD. HEENT: Head: normal shape and size; Eyes: PERRLA and EOMs intact;  Cardiovascular: Normal rate and rhythm. S1,S2 noted.  No murmur, rubs or gallops noted. No JVD or BLE edema. Pulmonary/Chest: Normal effort and positive vesicular breath sounds. No respiratory distress. No wheezes, rales or ronchi noted.  Neurological: Alert and oriented   BMET    Component Value Date/Time   NA 140 05/03/2019 1420   K 3.7 05/03/2019 1420   CL 104 05/03/2019 1420   CO2 26 05/03/2019 1420   GLUCOSE 130 (H) 05/03/2019 1420   BUN 26 (H) 05/03/2019 1420   CREATININE 1.27 (H) 05/03/2019 1420   CREATININE 1.03 (H) 01/18/2019 1559   CALCIUM 10.1 05/03/2019 1420   GFRNONAA 46 (L) 05/03/2019 1420   GFRAA 53 (L) 05/03/2019 1420    Lipid Panel     Component Value Date/Time     CHOL 194 01/18/2019 1559   TRIG 385 (H) 01/18/2019 1559   HDL 41 (L) 01/18/2019 1559   CHOLHDL 4.7 01/18/2019 1559   VLDL 58.0 (H) 05/11/2017 1024   LDLCALC 102 (H) 01/18/2019 1559    CBC    Component Value Date/Time   WBC 9.4 05/03/2019 1420   RBC 4.30 05/03/2019 1420   HGB 12.8 05/03/2019 1420   HCT 38.5 05/03/2019 1420   PLT 297 05/03/2019 1420   MCV 89.5 05/03/2019 1420   MCH 29.8 05/03/2019 1420   MCHC 33.2 05/03/2019 1420   RDW 12.0 05/03/2019 1420    Hgb A1C No results found for: HGBA1C         Assessment & Plan:    Webb Silversmith, NP This visit occurred during the SARS-CoV-2 public health emergency.  Safety protocols were in place, including screening questions prior to the visit, additional usage of staff PPE, and extensive cleaning of exam room while observing appropriate contact time as indicated for disinfecting solutions.

## 2019-10-07 NOTE — Assessment & Plan Note (Signed)
Uncontrolled RX for Amlodipine Benazepril 5-10 mg daily Encouraged DASH diet  RTC in 2 weeks for BP check

## 2019-10-21 ENCOUNTER — Ambulatory Visit: Payer: 59 | Admitting: Internal Medicine

## 2019-10-21 ENCOUNTER — Encounter: Payer: Self-pay | Admitting: Internal Medicine

## 2019-10-21 ENCOUNTER — Other Ambulatory Visit: Payer: Self-pay

## 2019-10-21 DIAGNOSIS — I10 Essential (primary) hypertension: Secondary | ICD-10-CM | POA: Diagnosis not present

## 2019-10-21 MED ORDER — AMLODIPINE BESY-BENAZEPRIL HCL 5-10 MG PO CAPS: 1.0000 | ORAL_CAPSULE | Freq: Every day | ORAL | 1 refills | Status: DC

## 2019-10-21 NOTE — Patient Instructions (Signed)

## 2019-10-21 NOTE — Progress Notes (Signed)
Subjective:    Patient ID: Hannah Brooks, female    DOB: 1960/11/29, 59 y.o.   MRN: [id number]  HPI  Pt presents to the clinic today for 2 week follow up of HTN. At her last visit, she was started on Amlodipine-Benazepril. She has been taking the medication as prescribed, denies adverse side effects. Her BP today is 128/74.  Review of Systems      Past Medical History:  Diagnosis Date  . Hypertension     Current Outpatient Medications  Medication Sig Dispense Refill  . amLODipine-benazepril (LOTREL) 5-10 MG capsule Take 1 capsule by mouth daily. 30 capsule 0  . fenofibrate 54 MG tablet Take 1 tablet (54 mg total) by mouth daily. 30 tablet 5   No current facility-administered medications for this visit.    No Known Allergies  Family History  Problem Relation Age of Onset  . Hypertension Mother   . Heart disease Father   . Cancer Neg Hx     Social History   Socioeconomic History  . Marital status: Married    Spouse name: Not on file  . Number of children: Not on file  . Years of education: Not on file  . Highest education level: Not on file  Occupational History  . Not on file  Tobacco Use  . Smoking status: Never Smoker  . Smokeless tobacco: Never Used  Substance and Sexual Activity  . Alcohol use: Yes    Comment: occasional  . Drug use: No  . Sexual activity: Not on file  Other Topics Concern  . Not on file  Social History Narrative  . Not on file   Social Determinants of Health   Financial Resource Strain:   . Difficulty of Paying Living Expenses:   Food Insecurity:   . Worried About Charity fundraiser in the Last Year:   . Arboriculturist in the Last Year:   Transportation Needs:   . Film/video editor (Medical):   Marland Kitchen Lack of Transportation (Non-Medical):   Physical Activity:   . Days of Exercise per Week:   . Minutes of Exercise per Session:   Stress:   . Feeling of Stress :   Social Connections:   . Frequency of Communication with  Friends and Family:   . Frequency of Social Gatherings with Friends and Family:   . Attends Religious Services:   . Active Member of Clubs or Organizations:   . Attends Archivist Meetings:   Marland Kitchen Marital Status:   Intimate Partner Violence:   . Fear of Current or Ex-Partner:   . Emotionally Abused:   Marland Kitchen Physically Abused:   . Sexually Abused:      Constitutional: Denies fever, malaise, fatigue, headache or abrupt weight changes.  Respiratory: Denies difficulty breathing, shortness of breath, cough or sputum production.   Cardiovascular: Denies chest pain, chest tightness, palpitations or swelling in the hands or feet.  Neurological: Denies dizziness, difficulty with memory, difficulty with speech or problems with balance and coordination.    No other specific complaints in a complete review of systems (except as listed in HPI above).  Objective:   Physical Exam  BP 128/74   Pulse 72   Temp (!) 97.4 F (36.3 C) (Temporal)   Wt 126 lb (57.2 kg)   SpO2 98%   BMI 23.05 kg/m   Wt Readings from Last 3 Encounters:  10/07/19 127 lb (57.6 kg)  05/03/19 137 lb (62.1 kg)  01/18/19 133 lb (60.3  kg)    General: Appears her stated age, well developed, well nourished in NAD. Cardiovascular: Normal rate and rhythm. S1,S2 noted.  No murmur, rubs or gallops noted. No JVD or BLE edema.  Pulmonary/Chest: Normal effort and positive vesicular breath sounds. No respiratory distress. No wheezes, rales or ronchi noted.  Neurological: Alert and oriented.    BMET    Component Value Date/Time   NA 140 05/03/2019 1420   K 3.7 05/03/2019 1420   CL 104 05/03/2019 1420   CO2 26 05/03/2019 1420   GLUCOSE 130 (H) 05/03/2019 1420   BUN 26 (H) 05/03/2019 1420   CREATININE 1.27 (H) 05/03/2019 1420   CREATININE 1.03 (H) 01/18/2019 1559   CALCIUM 10.1 05/03/2019 1420   GFRNONAA 46 (L) 05/03/2019 1420   GFRAA 53 (L) 05/03/2019 1420    Lipid Panel     Component Value Date/Time   CHOL  194 01/18/2019 1559   TRIG 385 (H) 01/18/2019 1559   HDL 41 (L) 01/18/2019 1559   CHOLHDL 4.7 01/18/2019 1559   VLDL 58.0 (H) 05/11/2017 1024   LDLCALC 102 (H) 01/18/2019 1559    CBC    Component Value Date/Time   WBC 9.4 05/03/2019 1420   RBC 4.30 05/03/2019 1420   HGB 12.8 05/03/2019 1420   HCT 38.5 05/03/2019 1420   PLT 297 05/03/2019 1420   MCV 89.5 05/03/2019 1420   MCH 29.8 05/03/2019 1420   MCHC 33.2 05/03/2019 1420   RDW 12.0 05/03/2019 1420    Hgb A1C No results found for: HGBA1C         Assessment & Plan:     Webb Silversmith, NP This visit occurred during the SARS-CoV-2 public health emergency.  Safety protocols were in place, including screening questions prior to the visit, additional usage of staff PPE, and extensive cleaning of exam room while observing appropriate contact time as indicated for disinfecting solutions.

## 2019-10-21 NOTE — Assessment & Plan Note (Signed)
Controlled on Benazepril-Amlodipine, refilled today Will monitor

## 2019-11-04 ENCOUNTER — Other Ambulatory Visit: Payer: Self-pay | Admitting: Internal Medicine

## 2020-02-03 ENCOUNTER — Encounter: Payer: 59 | Admitting: Internal Medicine

## 2020-05-17 ENCOUNTER — Other Ambulatory Visit: Payer: Self-pay | Admitting: Internal Medicine

## 2020-06-26 IMAGING — CT CT ANGIO HEAD
3 of 10 series · 18 of 47 positions shown · IV contrast (omnipaque)
Comparison: None.

CLINICAL DATA: Headache

EXAM:
CT ANGIOGRAPHY HEAD
TECHNIQUE: Multidetector CT imaging of the head was performed using the
standard protocol during bolus administration of intravenous
contrast. Multiplanar CT image reconstructions and MIPs were
obtained to evaluate the vascular anatomy.
CONTRAST:  100mL OMNIPAQUE IOHEXOL 350 MG/ML SOLN

[Series 10: ax thin · axial · 0.34mm/px · z∈[-137,-8]mm · 12 of 152 slices shown]
[im 11/152  brain]
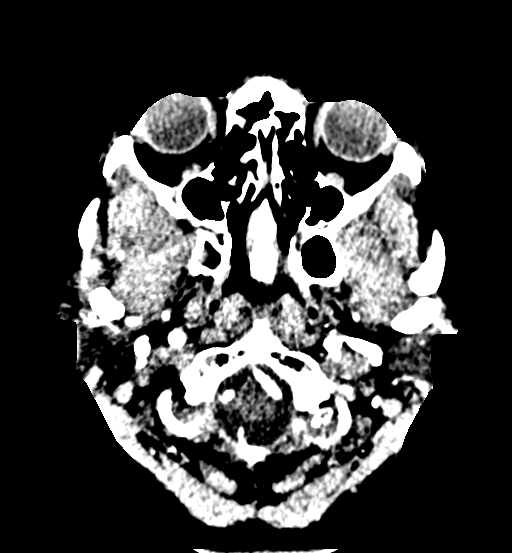
[im 21/152  bone]
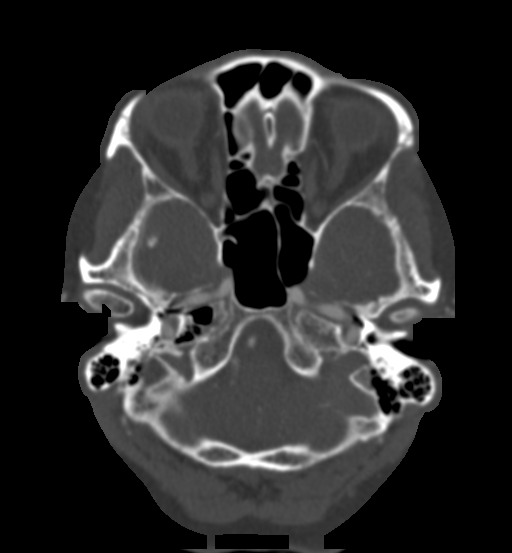
[im 31/152  brain]
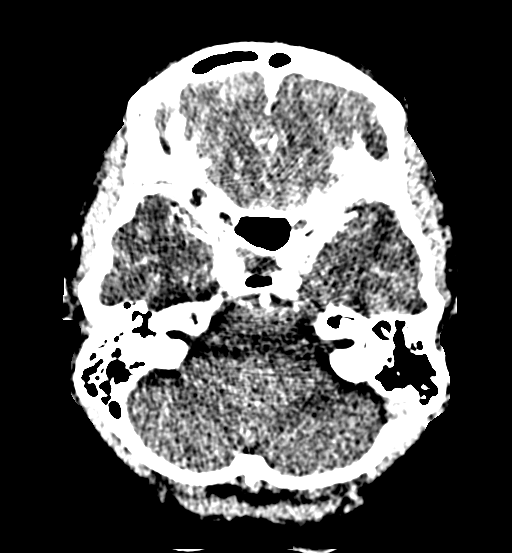
[im 51/152  bone]
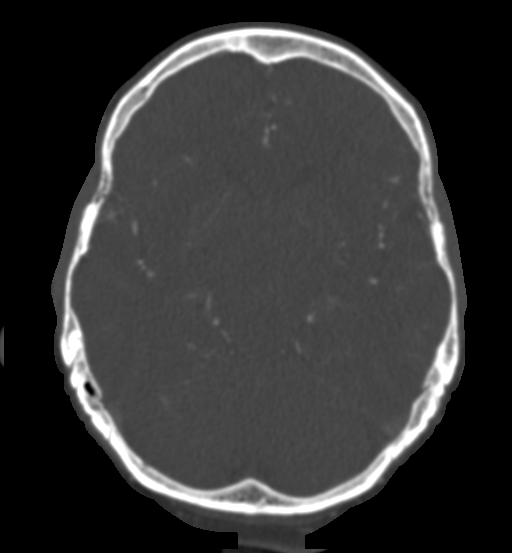
[im 61/152  brain]
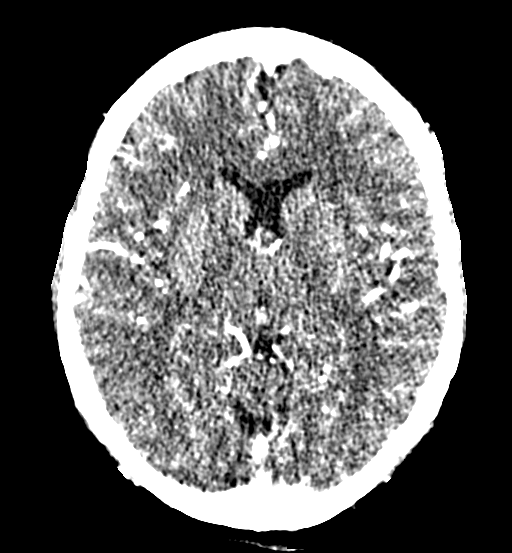
[im 71/152  bone]
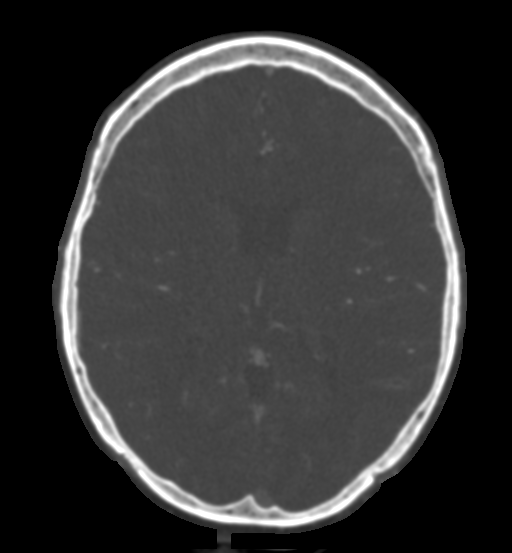
[im 81/152  brain]
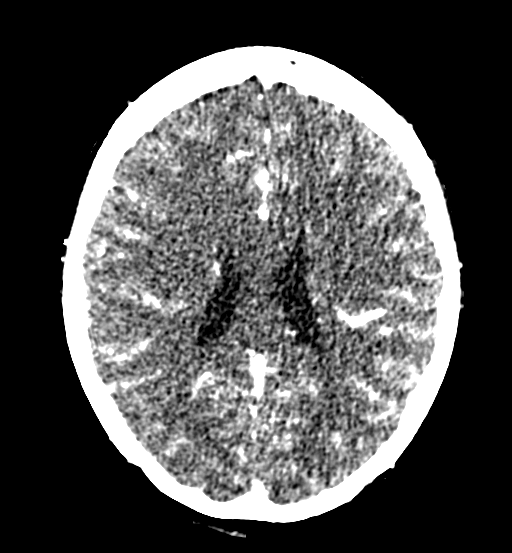
[im 91/152  bone]
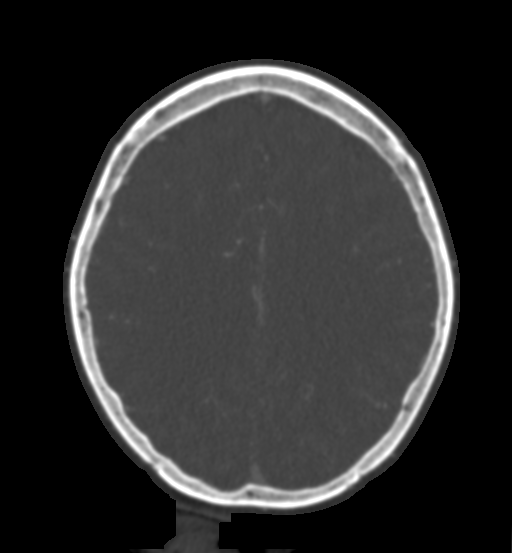
[im 101/152  brain]
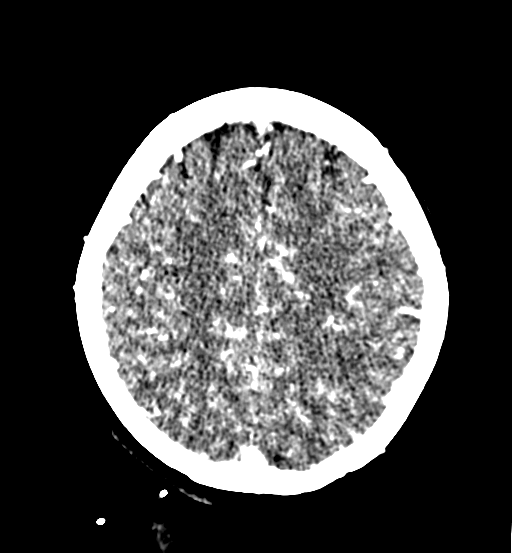
[im 121/152  bone]
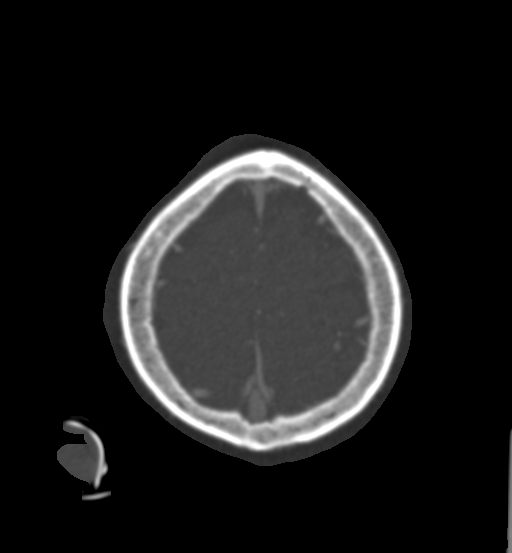
[im 131/152  brain]
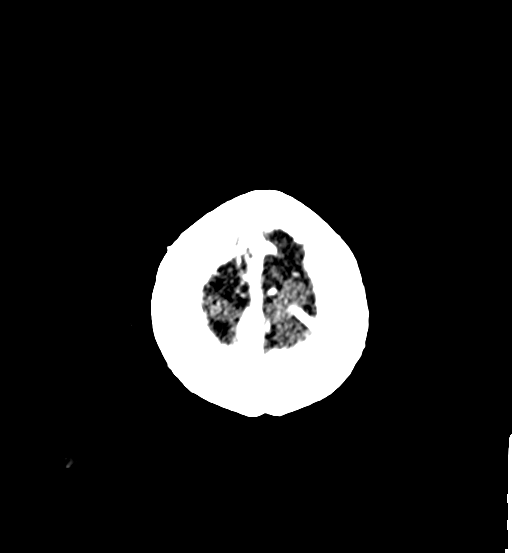
[im 141/152  bone]
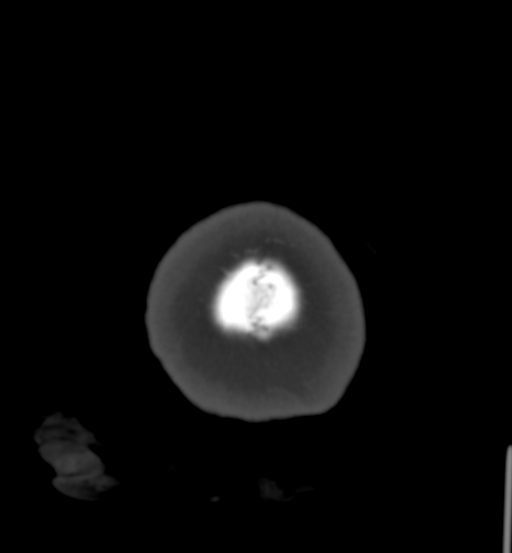

[Series 12: cor thin · coronal · 0.31mm/px · 3 of 178 slices shown]
[im 36/178  brain]
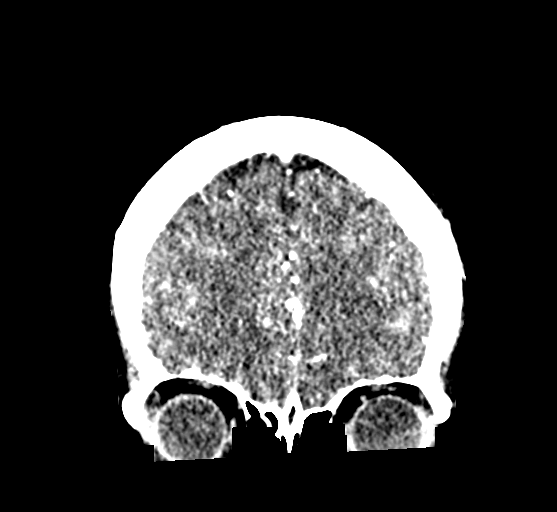
[im 71/178  brain]
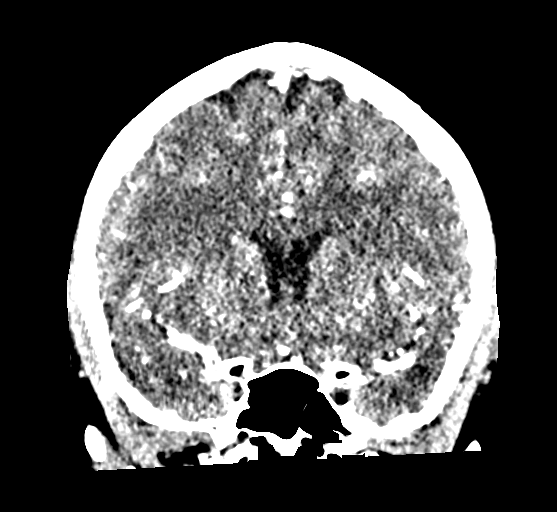
[im 107/178  brain]
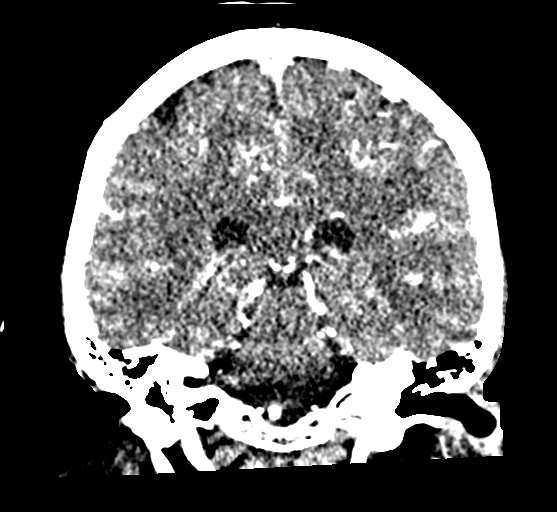

[Series 14: sag thin · sagittal · 0.31mm/px · 3 of 149 slices shown]
[im 38/149  brain]
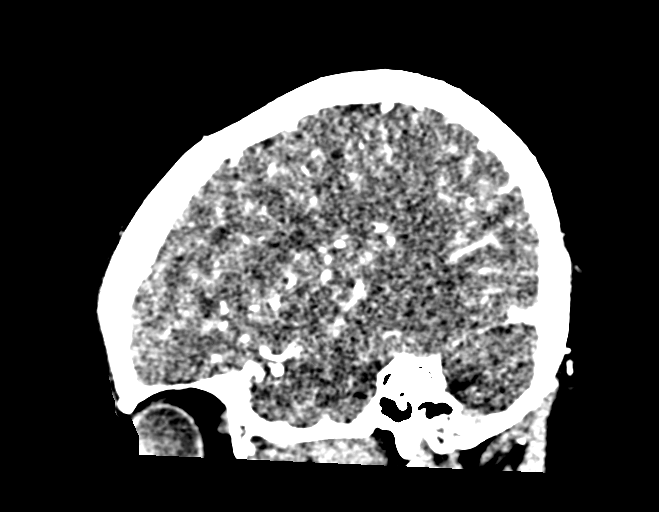
[im 75/149  brain]
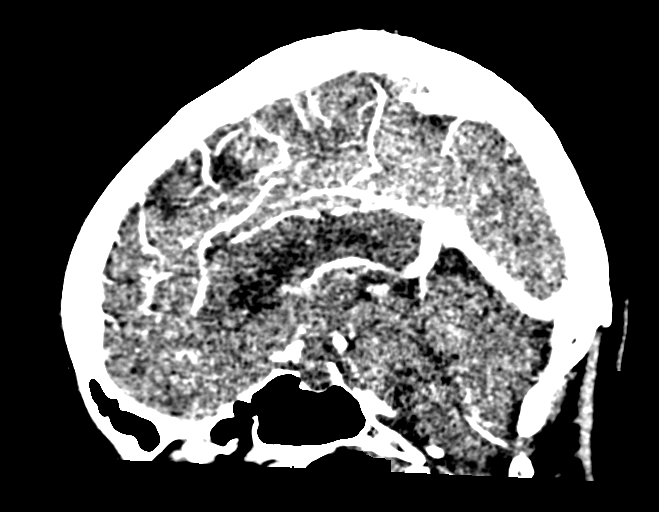
[im 112/149  brain]
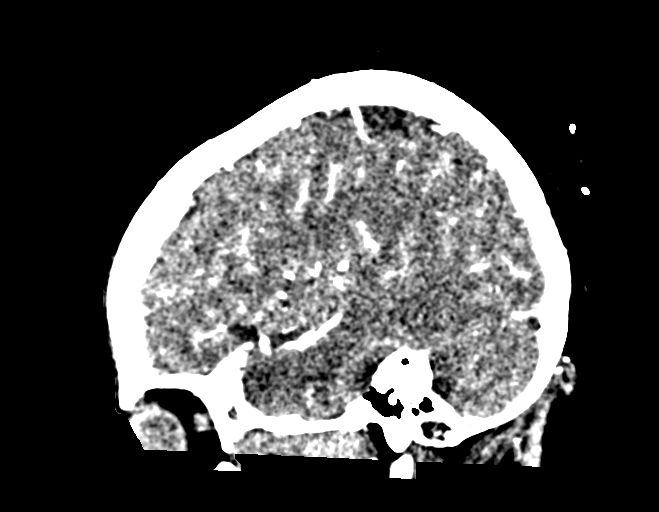

[18 of 47 positions shown; findings below may reference images not displayed]

FINDINGS: CT HEAD

Brain: There is no acute intracranial hemorrhage, mass-effect, or
edema. Gray-white differentiation is preserved. There is no
extra-axial fluid collection. Ventricles and sulci are within normal
limits in size and configuration.

Vascular: No hyperdense vessel or unexpected calcification.

Skull: Calvarium is unremarkable.

Sinuses/Orbits: No acute finding.

Other: None.

CTA HEAD

Anterior circulation: The intracranial internal carotid arteries,
anterior cerebral arteries, and middle cerebral arteries are patent.

Posterior circulation: Intracranial vertebral arteries, basilar
artery, and posterior cerebral arteries are patent. A left posterior
communicating artery is identified.

Venous sinuses: As permitted by contrast timing, patent.
IMPRESSION: No acute intracranial abnormality. No large vessel occlusion,
significant stenosis, or aneurysm.

## 2022-10-10 ENCOUNTER — Encounter: Payer: Self-pay | Admitting: Family

## 2022-10-10 ENCOUNTER — Ambulatory Visit: Payer: 59 | Admitting: Family

## 2022-10-10 ENCOUNTER — Encounter: Payer: Self-pay | Admitting: *Deleted

## 2022-10-10 VITALS — BP 130/82 | HR 72 | Temp 97.3°F | Ht 61.5 in | Wt 119.0 lb

## 2022-10-10 DIAGNOSIS — E781 Pure hyperglyceridemia: Secondary | ICD-10-CM

## 2022-10-10 DIAGNOSIS — R5383 Other fatigue: Secondary | ICD-10-CM | POA: Diagnosis not present

## 2022-10-10 DIAGNOSIS — Z780 Asymptomatic menopausal state: Secondary | ICD-10-CM

## 2022-10-10 DIAGNOSIS — Z1211 Encounter for screening for malignant neoplasm of colon: Secondary | ICD-10-CM

## 2022-10-10 DIAGNOSIS — Z8679 Personal history of other diseases of the circulatory system: Secondary | ICD-10-CM

## 2022-10-10 DIAGNOSIS — H579 Unspecified disorder of eye and adnexa: Secondary | ICD-10-CM

## 2022-10-10 DIAGNOSIS — E041 Nontoxic single thyroid nodule: Secondary | ICD-10-CM

## 2022-10-10 DIAGNOSIS — Z0001 Encounter for general adult medical examination with abnormal findings: Secondary | ICD-10-CM | POA: Diagnosis not present

## 2022-10-10 DIAGNOSIS — Z1231 Encounter for screening mammogram for malignant neoplasm of breast: Secondary | ICD-10-CM

## 2022-10-10 DIAGNOSIS — L989 Disorder of the skin and subcutaneous tissue, unspecified: Secondary | ICD-10-CM | POA: Diagnosis not present

## 2022-10-10 LAB — COMPREHENSIVE METABOLIC PANEL
ALT: 17 U/L (ref 0–35)
AST: 19 U/L (ref 0–37)
Albumin: 4.2 g/dL (ref 3.5–5.2)
Alkaline Phosphatase: 79 U/L (ref 39–117)
BUN: 21 mg/dL (ref 6–23)
CO2: 25 mEq/L (ref 19–32)
Calcium: 9.2 mg/dL (ref 8.4–10.5)
Chloride: 106 mEq/L (ref 96–112)
Creatinine, Ser: 0.76 mg/dL (ref 0.40–1.20)
GFR: 82.88 mL/min (ref >60.00)
Glucose, Bld: 111 mg/dL — ABNORMAL HIGH (ref 70–99)
Potassium: 4.4 mEq/L (ref 3.5–5.1)
Sodium: 139 mEq/L (ref 135–145)
Total Bilirubin: 0.6 mg/dL (ref 0.2–1.2)
Total Protein: 7.8 g/dL (ref 6.0–8.3)

## 2022-10-10 LAB — LIPID PANEL
Cholesterol: 201 mg/dL — ABNORMAL HIGH (ref 0–200)
HDL: 47.5 mg/dL (ref >39.00)
LDL Cholesterol: 127 mg/dL — ABNORMAL HIGH (ref 0–99)
NonHDL: 153.17
Total CHOL/HDL Ratio: 4
Triglycerides: 131 mg/dL (ref 0.0–149.0)
VLDL: 26.2 mg/dL (ref 0.0–40.0)

## 2022-10-10 LAB — TSH: TSH: 1.53 u[IU]/mL (ref 0.35–5.50)

## 2022-10-10 LAB — CBC
HCT: 41.5 % (ref 36.0–46.0)
Hemoglobin: 13.6 g/dL (ref 12.0–15.0)
MCHC: 32.7 g/dL (ref 30.0–36.0)
MCV: 91.5 fl (ref 78.0–100.0)
Platelets: 291 10*3/uL (ref 150.0–400.0)
RBC: 4.53 Mil/uL (ref 3.87–5.11)
RDW: 12.5 % (ref 11.5–15.5)
WBC: 7.7 10*3/uL (ref 4.0–10.5)

## 2022-10-10 LAB — T4, FREE: Free T4: 0.84 ng/dL (ref 0.60–1.60)

## 2022-10-10 NOTE — Assessment & Plan Note (Signed)
Bone density ordered pending results.   

## 2022-10-10 NOTE — Patient Instructions (Addendum)
I have sent an electronic order over to your preferred location for the following:   []   2D Mammogram  [x]   3D Mammogram  [x]   Bone Density   Please give this center a call to get scheduled at your convenience.  [x]   [facility]  [address] [place] [address]  [phone]  Make sure to wear two piece  clothing  No lotions powders or deodorants the day of the appointment Make sure to bring picture ID and insurance card.  Bring list of medications you are currently taking including any supplements.   ------------------------------------  I have ordered imaging for you at [facility] outpatient diagnostic center for ultrasound of the thyroid. This order has been sent over for you electronically.  Please call [phone] to schedule this appointment.  ------------------------------------  A referral was placed today for Gi to set up your colonoscopy.  Please let us know if you have not heard back within 1 week about your referral.  A referral was placed today for dermatology Please let us know if you have not heard back within 2 weeks about the referral.  ------------------------------------ Welcome to our clinic, I am happy to have you as my new patient. I am excited to continue on this healthcare journey with you.  ------------------------------------  Stop by the lab prior to leaving today. I will notify you of your results once received.   Please keep in mind Any my chart messages you send have up to a three business day turnaround for a response.  Phone calls may take up to a one full business day turnaround for a  response.   If you need a medication refill I recommend you request it through the pharmacy as this is easiest for [place] rather than sending a message and or phone call.   Due to recent changes in healthcare laws, you may see results of your imaging and/or laboratory studies on MyChart before I have  had a chance to review them.  I understand that in some cases there may be results that are confusing or concerning to you. Please understand that not all results are received at the same time and often I may need to interpret multiple results in order to provide you with the best plan of care or course of treatment. Therefore, I ask that you please give me 2 business days to thoroughly review all your results before contacting my office for clarification. Should we see a critical lab result, you will be contacted sooner.   It was a pleasure seeing you today! Please do not hesitate to reach out with any questions and or concerns.  Regards,   Mort Sawyers FNP-C

## 2022-10-10 NOTE — Assessment & Plan Note (Signed)
Pt advised of the following:  Continue medication as prescribed. Monitor blood pressure periodically and/or when you feel symptomatic. Goal is <130/90 on average. Ensure that you have rested for 30 minutes prior to checking your blood pressure. Record your readings and bring them to your next visit if necessary.work on a low sodium diet.  

## 2022-10-10 NOTE — Assessment & Plan Note (Signed)
Referral placed for dermatology. 

## 2022-10-10 NOTE — Assessment & Plan Note (Signed)
Patient Counseling(The following topics were reviewed): ? Preventative care handout given to pt  ?Health maintenance and immunizations reviewed. Please refer to Health maintenance section. ?Pt advised on safe sex, wearing seatbelts in car, and proper nutrition ?labwork ordered today for annual ?Dental health: Discussed importance of regular tooth brushing, flossing, and dental visits. ? ? ?

## 2022-10-10 NOTE — Assessment & Plan Note (Signed)
Suspect this is due to allergic rhinitis. Recommendations for daily flonase to see if this improves

## 2022-10-10 NOTE — Assessment & Plan Note (Signed)
Ordered lipid panel, pending results. Work on low cholesterol diet and exercise as tolerated  

## 2022-10-10 NOTE — Progress Notes (Signed)
Subjective:  Patient ID: Hannah Brooks, female    DOB: [birth year]  Age: 62 y.o. MRN: [id number]  Patient Care Team: Mort Sawyers, FNP as PCP - General (Family Medicine)   CC:  Chief Complaint  Patient presents with   Establish Care    Toc from [facility]    Referral    Dermatology    Annual Exam    HPI Hannah Brooks is a 62 y.o. female who presents today for an annual physical exam. She reports consuming a general low cholesterol diet. Home exercise routine includes treadmill. She generally feels well. She reports sleeping fairly well. She does have additional problems to discuss today.   Vision:Not within last year Dental:Receives regular dental care STD:The patient denies history of sexually transmitted disease.   Mammogram: overdue  Last pap: has not had in some time, will schedule Colonoscopy:never had, wants to be referred Bone density scan:never had, will order today   Pt is with acute concerns.  Prior provider was: Nicki Reaper, FNP     Pt is with acute concerns.  White spots and black heads, wants referral to dermatology.  Does also note some swelling for the last three years with under her eyes. She denies allergies however she is sniffing a lot today on exam.  chronic concerns:  HTN in the past: she states stopped amlodipine and has been monitoring her blood pressure at home and seems to be  She states that she has started exercising and eating better.    Hyperlipidemia: has not taken fenofibrate for some time. Trying to work on low cholesterol diet.  Advanced Directives Patient does not have advanced directives  DEPRESSION SCREENING    10/10/2022    7:42 AM 01/18/2019    3:22 PM 05/11/2017   10:11 AM 08/22/2015    7:53 PM 02/10/2015    2:29 PM 12/23/2013    3:29 PM  PHQ 2/9 Scores  PHQ - 2 Score 2 0 0 0 0 0  PHQ- 9 Score 10          ROS: Negative unless specifically indicated above in HPI.   No current outpatient medications on file.     Objective:    BP 130/82   Pulse 72   Temp (!) 97.3 F (36.3 C) (Temporal)   Ht 5' 1.5" (1.562 m)   Wt 119 lb (54 kg)   SpO2 98%   BMI 22.12 kg/m   BP Readings from Last 3 Encounters:  10/10/22 130/82  10/21/19 128/74  10/07/19 (!) 170/96      Physical Exam Constitutional:      General: She is not in acute distress.    Appearance: Normal appearance. She is normal weight. She is not ill-appearing.  HENT:     Head: Normocephalic.     Right Ear: Tympanic membrane normal.     Left Ear: Tympanic membrane normal.     Nose: Nose normal.     Right Turbinates: Enlarged.     Left Turbinates: Enlarged.     Mouth/Throat:     Mouth: Mucous membranes are moist.  Eyes:     Extraocular Movements: Extraocular movements intact.     Pupils: Pupils are equal, round, and reactive to light.  Neck:     Thyroid: Thyroid tenderness (left side with slight promimence) present.  Cardiovascular:     Rate and Rhythm: Normal rate and regular rhythm.  Pulmonary:     Effort: Pulmonary effort is normal.     Breath sounds: Normal breath  sounds.  Abdominal:     General: Abdomen is flat. Bowel sounds are normal.     Palpations: Abdomen is soft.     Tenderness: There is no guarding or rebound.  Musculoskeletal:        General: Normal range of motion.     Cervical back: Normal range of motion.  Skin:    General: Skin is warm.     Capillary Refill: Capillary refill takes less than 2 seconds.  Neurological:     General: No focal deficit present.     Mental Status: She is alert.  Psychiatric:        Mood and Affect: Mood normal.        Behavior: Behavior normal.        Thought Content: Thought content normal.        Judgment: Judgment normal.          Assessment & Plan:  Skin lesion Assessment & Plan: Referral placed for dermatology  Orders: -     Ambulatory referral to Dermatology  Screening for colon cancer -     Ambulatory referral to  Gastroenterology  Postmenopausal Assessment & Plan: Bone density ordered pending results.    Orders: -     DG Bone Density; Future  Hypertriglyceridemia Assessment & Plan: Ordered lipid panel, pending results. Work on low cholesterol diet and exercise as tolerated   Orders: -     Lipid panel  Encounter for general adult medical examination with abnormal findings Assessment & Plan: Patient Counseling(The following topics were reviewed):  Preventative care handout given to pt  Health maintenance and immunizations reviewed. Please refer to Health maintenance section. Pt advised on safe sex, wearing seatbelts in car, and proper nutrition labwork ordered today for annual Dental health: Discussed importance of regular tooth brushing, flossing, and dental visits.   Orders: -     Ambulatory referral to Dermatology -     Ambulatory referral to Gastroenterology -     3D Screening Mammogram, Left and Right; Future -     DG Bone Density; Future -     TSH -     T4, free -     CBC -     Comprehensive metabolic panel -     Lipid panel  Abnormal eye finding Assessment & Plan: Suspect this is due to allergic rhinitis. Recommendations for daily flonase to see if this improves  Orders: -     TSH -     T4, free  Other fatigue -     TSH -     T4, free -     CBC  Screening mammogram for breast cancer -     3D Screening Mammogram, Left and Right; Future  Thyroid nodule -     US THYROID; Future  History of hypertension Assessment & Plan: Pt advised of the following:  Continue medication as prescribed. Monitor blood pressure periodically and/or when you feel symptomatic. Goal is <130/90 on average. Ensure that you have rested for 30 minutes prior to checking your blood pressure. Record your readings and bring them to your next visit if necessary.work on a low sodium diet.        Follow-up: Return for schedule pap when pt would like to come back otherwise 1 year cpe f/u.    Mort Sawyers, FNP

## 2022-10-19 ENCOUNTER — Encounter: Payer: Self-pay | Admitting: *Deleted

## 2024-04-02 ENCOUNTER — Encounter: Admitting: Family

## 2024-04-15 ENCOUNTER — Encounter: Admitting: Family

## 2024-11-18 ENCOUNTER — Encounter: Admitting: Family
